# Patient Record
Sex: Female | Born: 1945 | Race: White | Hispanic: No | Marital: Married | Smoking: Never smoker
Health system: Southern US, Community
[De-identification: ages and names within clinical notes are randomized; demographics above are authoritative.]

## PROBLEM LIST (undated history)

## (undated) DIAGNOSIS — N951 Menopausal and female climacteric states: Secondary | ICD-10-CM

## (undated) DIAGNOSIS — E039 Hypothyroidism, unspecified: Secondary | ICD-10-CM

## (undated) DIAGNOSIS — Z8619 Personal history of other infectious and parasitic diseases: Secondary | ICD-10-CM

## (undated) DIAGNOSIS — N6310 Unspecified lump in the right breast, unspecified quadrant: Secondary | ICD-10-CM

## (undated) DIAGNOSIS — M1990 Unspecified osteoarthritis, unspecified site: Secondary | ICD-10-CM

## (undated) DIAGNOSIS — M351 Other overlap syndromes: Secondary | ICD-10-CM

## (undated) DIAGNOSIS — Z8719 Personal history of other diseases of the digestive system: Secondary | ICD-10-CM

## (undated) DIAGNOSIS — M8580 Other specified disorders of bone density and structure, unspecified site: Secondary | ICD-10-CM

## (undated) DIAGNOSIS — M359 Systemic involvement of connective tissue, unspecified: Secondary | ICD-10-CM

## (undated) DIAGNOSIS — K219 Gastro-esophageal reflux disease without esophagitis: Secondary | ICD-10-CM

## (undated) DIAGNOSIS — K388 Other specified diseases of appendix: Secondary | ICD-10-CM

## (undated) HISTORY — DX: Unspecified lump in the right breast, unspecified quadrant: N63.10

## (undated) HISTORY — DX: Other overlap syndromes: M35.1

## (undated) HISTORY — DX: Hypothyroidism, unspecified: E03.9

## (undated) HISTORY — DX: Menopausal and female climacteric states: N95.1

## (undated) HISTORY — PX: COLONOSCOPY: SHX174

## (undated) HISTORY — DX: Unspecified osteoarthritis, unspecified site: M19.90

## (undated) HISTORY — DX: Gastro-esophageal reflux disease without esophagitis: K21.9

---

## 2011-08-24 ENCOUNTER — Encounter (INDEPENDENT_AMBULATORY_CARE_PROVIDER_SITE_OTHER): Payer: Self-pay | Admitting: *Deleted

## 2011-09-04 ENCOUNTER — Ambulatory Visit (INDEPENDENT_AMBULATORY_CARE_PROVIDER_SITE_OTHER): Payer: 59 | Admitting: Internal Medicine

## 2011-09-04 ENCOUNTER — Other Ambulatory Visit (INDEPENDENT_AMBULATORY_CARE_PROVIDER_SITE_OTHER): Payer: Self-pay | Admitting: *Deleted

## 2011-09-04 ENCOUNTER — Telehealth (INDEPENDENT_AMBULATORY_CARE_PROVIDER_SITE_OTHER): Payer: Self-pay | Admitting: *Deleted

## 2011-09-04 ENCOUNTER — Encounter (INDEPENDENT_AMBULATORY_CARE_PROVIDER_SITE_OTHER): Payer: Self-pay | Admitting: Internal Medicine

## 2011-09-04 VITALS — BP 108/58 | HR 72 | Temp 97.9°F | Ht 66.0 in | Wt 150.7 lb

## 2011-09-04 DIAGNOSIS — E039 Hypothyroidism, unspecified: Secondary | ICD-10-CM | POA: Insufficient documentation

## 2011-09-04 DIAGNOSIS — Z1211 Encounter for screening for malignant neoplasm of colon: Secondary | ICD-10-CM

## 2011-09-04 DIAGNOSIS — K219 Gastro-esophageal reflux disease without esophagitis: Secondary | ICD-10-CM

## 2011-09-04 DIAGNOSIS — Z139 Encounter for screening, unspecified: Secondary | ICD-10-CM

## 2011-09-04 MED ORDER — PEG-KCL-NACL-NASULF-NA ASC-C 100 G PO SOLR: 1.0000 | Freq: Once | ORAL | Status: DC

## 2011-09-04 NOTE — Telephone Encounter (Signed)
Patient needs movi prep 

## 2011-09-04 NOTE — Progress Notes (Signed)
Subjective:     Patient ID: Pamela Morrow. [place], female   DOB: 11-Oct-1945, 66 y.o.   MRN: [id number]  HPI Referred by Dr. Sherryll Burger for a screening colonoscopy.  She tells me she lost a sister 1 1/2 yrs ago with cervical cancer.  She tells me she has epigastric pain and stress after her sister died. She tells me now she has acid reflux. No problems till her sister died. She says her throat feels scratchy.  She has frequent burping.  Sometimes capsules or pills feel like they are lodging. She tried Nexium and gave her headaches.  She does not want to take a PPI till she knows she has to. She is requesting an EGD.  Father died of esophagea cancer.  Father was a drinker and a smoker. BMs x once a day in am. No melena or bright red rectal bleeding.   Review of Systems see hpi Current Outpatient Prescriptions  Medication Sig Dispense Refill  . acetaminophen (TYLENOL) 325 MG tablet Take 650 mg by mouth every 6 (six) hours as needed.      . Cholecalciferol (VITAMIN D) 2000 UNITS tablet Take 2,000 Units by mouth daily.      . naltrexone (DEPADE) 50 MG tablet Take 3 mg by mouth daily.      . progesterone (ENDOMETRIN) 100 MG vaginal insert Place 100 mg vaginally 2 (two) times daily.      Marland Kitchen Specialty Vitamins Products (MAGNESIUM, AMINO ACID CHELATE,) 133 MG tablet Take 1 tablet by mouth 2 (two) times daily.      Marland Kitchen thyroid (ARMOUR) 30 MG tablet Take 30 mg by mouth daily.       Past Medical History  Diagnosis Date  . Hypothyroid    No family status information on file.   History   Social History  . Marital Status: Married    Spouse Name: N/A    Number of Children: N/A  . Years of Education: N/A   Occupational History  . Not on file.   Social History Main Topics  . Smoking status: Never Smoker   . Smokeless tobacco: Not on file  . Alcohol Use: Yes     2 drinks a month  . Drug Use: No  . Sexually Active: Not on file   Other Topics Concern  . Not on file   Social History Narrative  . No  narrative on file   Allergies  Allergen Reactions  . Codeine     Nausea        Objective:   Physical Exam Filed Vitals:   09/04/11 0949  Height: 5\' 6"  (1.676 m)  Weight: 150 lb 11.2 oz (68.357 kg)   Alert and oriented. Skin warm and dry. Oral mucosa is moist.   . Sclera anicteric, conjunctivae is pink. Thyroid not enlarged. No cervical lymphadenopathy. Lungs clear. Heart regular rate and rhythm.  Abdomen is soft. Bowel sounds are positive. No hepatomegaly. No abdominal masses felt. No tenderness.  No edema to lower extremities.       Assessment:    GERD uncontrolled at this time. She does not want to be maintained on a PPI till she has EGD.  She has never undergone a screening colonoscopy. No family hx of colon cancer. Hx of esophageal cancer: father.  Father had hx of ETOH abuse and was a smoker.   Plan:    EGD/Colonoscopy. The risks and benefits such as perforation, bleeding, and infection were reviewed with the patient and is agreeable.

## 2011-09-04 NOTE — Patient Instructions (Addendum)
EGD/Colonoscopy. The risks and benefits such as perforation, bleeding, and infection were reviewed with the patient and is agreeable. 

## 2011-09-19 ENCOUNTER — Encounter (HOSPITAL_COMMUNITY): Payer: Self-pay | Admitting: Pharmacy Technician

## 2011-10-03 MED ORDER — SODIUM CHLORIDE 0.45 % IV SOLN
INTRAVENOUS | Status: DC
  Administered 2011-10-04: 1000 mL via INTRAVENOUS

## 2011-10-04 ENCOUNTER — Encounter (HOSPITAL_COMMUNITY)
Admission: RE | Disposition: A | Discharge status: Home / Self Care | Payer: Self-pay | Source: Ambulatory Visit | Attending: Internal Medicine

## 2011-10-04 ENCOUNTER — Ambulatory Visit (HOSPITAL_COMMUNITY)
Admission: RE | Admit: 2011-10-04 | Discharge: 2011-10-04 | Disposition: A | Discharge status: Home / Self Care | Payer: 59 | Source: Ambulatory Visit | Attending: Internal Medicine | Admitting: Internal Medicine

## 2011-10-04 ENCOUNTER — Encounter (HOSPITAL_COMMUNITY): Payer: Self-pay | Admitting: *Deleted

## 2011-10-04 DIAGNOSIS — K228 Other specified diseases of esophagus: Secondary | ICD-10-CM

## 2011-10-04 DIAGNOSIS — K6389 Other specified diseases of intestine: Secondary | ICD-10-CM

## 2011-10-04 DIAGNOSIS — Z139 Encounter for screening, unspecified: Secondary | ICD-10-CM

## 2011-10-04 DIAGNOSIS — K2100 Gastro-esophageal reflux disease with esophagitis, without bleeding: Secondary | ICD-10-CM | POA: Insufficient documentation

## 2011-10-04 DIAGNOSIS — K219 Gastro-esophageal reflux disease without esophagitis: Secondary | ICD-10-CM

## 2011-10-04 DIAGNOSIS — A048 Other specified bacterial intestinal infections: Secondary | ICD-10-CM | POA: Insufficient documentation

## 2011-10-04 DIAGNOSIS — K208 Other esophagitis: Secondary | ICD-10-CM

## 2011-10-04 DIAGNOSIS — Z1211 Encounter for screening for malignant neoplasm of colon: Secondary | ICD-10-CM | POA: Insufficient documentation

## 2011-10-04 DIAGNOSIS — R1013 Epigastric pain: Secondary | ICD-10-CM | POA: Insufficient documentation

## 2011-10-04 DIAGNOSIS — D126 Benign neoplasm of colon, unspecified: Secondary | ICD-10-CM | POA: Insufficient documentation

## 2011-10-04 DIAGNOSIS — K2940 Chronic atrophic gastritis without bleeding: Secondary | ICD-10-CM | POA: Insufficient documentation

## 2011-10-04 DIAGNOSIS — K644 Residual hemorrhoidal skin tags: Secondary | ICD-10-CM | POA: Insufficient documentation

## 2011-10-04 SURGERY — COLONOSCOPY WITH ESOPHAGOGASTRODUODENOSCOPY (EGD)
Anesthesia: Moderate Sedation

## 2011-10-04 MED ORDER — BUTAMBEN-TETRACAINE-BENZOCAINE 2-2-14 % EX AERO
INHALATION_SPRAY | CUTANEOUS | Status: DC | PRN
  Administered 2011-10-04: 1 via TOPICAL
  Administered 2011-10-04: 2 via TOPICAL

## 2011-10-04 MED ORDER — MEPERIDINE HCL 50 MG/ML IJ SOLN
INTRAMUSCULAR | Status: AC
  Filled 2011-10-04: qty 1

## 2011-10-04 MED ORDER — MIDAZOLAM HCL 5 MG/5ML IJ SOLN
INTRAMUSCULAR | Status: DC | PRN
  Administered 2011-10-04 (×5): 2 mg via INTRAVENOUS

## 2011-10-04 MED ORDER — MEPERIDINE HCL 50 MG/ML IJ SOLN
INTRAMUSCULAR | Status: DC | PRN
  Administered 2011-10-04 (×2): 25 mg via INTRAVENOUS

## 2011-10-04 MED ORDER — STERILE WATER FOR IRRIGATION IR SOLN
Status: DC | PRN
  Administered 2011-10-04 (×2)

## 2011-10-04 MED ORDER — MIDAZOLAM HCL 5 MG/5ML IJ SOLN
INTRAMUSCULAR | Status: AC
  Filled 2011-10-04: qty 10

## 2011-10-04 MED ORDER — PANTOPRAZOLE SODIUM 40 MG PO TBEC: 40.0000 mg | DELAYED_RELEASE_TABLET | Freq: Every day | ORAL | Status: DC

## 2011-10-04 NOTE — Op Note (Signed)
EGD PROCEDURE REPORT  PATIENT:  Pamela Morrow. Pamela Morrow  MR#:  [id number] Birthdate:  05-14-1945, 66 y.o., female Endoscopist:  Dr. Malissa Hippo, MD Referred By:  Dr. Kirstie Peri, MD Procedure Date: 10/04/2011  Procedure:   EGD & Colonoscopy  Indications:  Patient is 66 year old Caucasian female with epigastric pain intermittent heartburn and regurgitation of recent onset. She is undergoing diagnostic EGD followed by screening colonoscopy.            Informed Consent:  The risks, benefits, alternatives & imponderables which include, but are not limited to, bleeding, infection, perforation, drug reaction and potential missed lesion have been reviewed.  The potential for biopsy, lesion removal, esophageal dilation, etc. have also been discussed.  Questions have been answered.  All parties agreeable.  Please see history & physical in medical record for more information.  Medications:  Demerol 50 mg IV Versed 10 mg IV Cetacaine spray topically for oropharyngeal anesthesia  EGD  Description of procedure:  The endoscope was introduced through the mouth and advanced to the second portion of the duodenum without difficulty or limitations. The mucosal surfaces were surveyed very carefully during advancement of the scope and upon withdrawal.  Findings:  Esophagus:  Mucosa of the proximal and middle third was normal. Distally 2 small erosions noted in the background of erythematous mucosa just proximal to GE junction. Focal edema and erythema noted at GE junction. No esophageal varices noted. GEJ:  37cm Hiatus:  39 cm Stomach:  Stomach was empty and distended very well with insufflation. Folds in the proximal stomach were normal. Examination of mucosa at body revealed patchy erythema as well as mucosal edema. These changes suspicious for portal gastropathy. Antral mucosa was normal. Pyloric channel was patent. Angularis fundus and cardia were examined by retroflexing the scope and were normal. Duodenum:   Normal bulbar and post bulbar mucosa.  Therapeutic/Diagnostic Maneuvers Performed:  Biopsy taken from mucosa at gastric body for routine histology.  COLONOSCOPY Description of procedure:  After a digital rectal exam was performed, that colonoscope was advanced from the anus through the rectum and colon to the area of the cecum, ileocecal valve and appendiceal orifice. The cecum was deeply intubated. These structures were well-seen and photographed for the record. From the level of the cecum and ileocecal valve, the scope was slowly and cautiously withdrawn. The mucosal surfaces were carefully surveyed utilizing scope tip to flexion to facilitate fold flattening as needed. The scope was pulled down into the rectum where a thorough exam including retroflexion was performed.  Findings:   Prep satisfactory. 3 mm polyp ablated via cold biopsy from cecum. 12 mm round submucosal lesion involving blunt and the cecum possibly submucosal leiomyoma. Normal rectal mucosa. Small hemorrhoids below the dentate line.  Therapeutic/Diagnostic Maneuvers Performed:  See above.  Complications:  None  Cecal Withdrawal Time:  14 minutes  Impression:  Erosive reflux esophagitis and small sliding hiatal hernia. Mucosal changes involving gastric body suspicious for portal gastropathy. Biopsy taken. No evidence of esophageal or gastric varices. 3 mm polyp ablated via cold biopsy from cecum. 12 mm submucosal lesion at cecum possibly leiomyoma. Small external hemorrhoids.  Recommendations:  Anti-reflux measures as before. Pantoprazole 40 mg by mouth every morning. Abdominopelvic CT with contrast to examine liver and cecum.  Pamela Morrow U  10/04/2011 1:44 PM  CC: Dr. Orvilla Cornwall C & Dr. Bonnetta Barry ref. provider found

## 2011-10-04 NOTE — H&P (Signed)
Pamela Morrow is an 66 y.o. female.   Chief Complaint: Patient is here for EGD and colonoscopy. HPI; Patient is 22 Caucasian female who presents with intermittent epigastric pain heartburn and nocturnal regurgitation. Medications she wakes up in the morning scratchy throat. She was prescribed Nexium by her primary care physician Dr. Sherryll Burger headache and therefore discontinued medication. She's been using OTC antacids on when necessary basis. She denies dysphagia melena or rectal bleeding. She is also undergoing screening colonoscopy. Him history is negative for colorectal carcinoma but positive for cervical carcinoma in her sister who died 2 years ago at age 72. Her father died of esophageal cancer at age 34.   Past Medical History  Diagnosis Date  . Hypothyroid     No past surgical history on file.  Family History  Problem Relation Age of Onset  . Colitis Sister    Social History:  reports that she has never smoked. She does not have any smokeless tobacco history on file. She reports that she drinks alcohol. She reports that she does not use illicit drugs.  Allergies:  Allergies  Allergen Reactions  . Codeine     Nausea    Medications Prior to Admission  Medication Sig Dispense Refill  . acetaminophen (TYLENOL) 325 MG tablet Take 650 mg by mouth every 6 (six) hours as needed. For pain      . Cholecalciferol (HM VITAMIN D3) 4000 UNITS CAPS Take 1 capsule by mouth daily.      . naltrexone (DEPADE) 50 MG tablet Take 50 mg by mouth daily.       Marland Kitchen PRESCRIPTION MEDICATION Apply 1 application topically daily. Compounded natural estrogen cream      . progesterone (ENDOMETRIN) 100 MG vaginal insert Place 100 mg vaginally daily.       Marland Kitchen Specialty Vitamins Products (MAGNESIUM, AMINO ACID CHELATE,) 133 MG tablet Take 1 tablet by mouth 2 (two) times daily.      Marland Kitchen thyroid (ARMOUR) 30 MG tablet Take 30 mg by mouth daily.        No results found for this or any previous visit (from the past 48  hour(s)). No results found.  ROS  Blood pressure 134/84, pulse 80, temperature 97.5 F (36.4 C), temperature source Oral, resp. rate 16, height 5\' 6"  (1.676 m), weight 148 lb (67.132 kg), SpO2 99.00%. Physical Exam  Constitutional: She appears well-developed and well-nourished.  HENT:  Mouth/Throat: Oropharynx is clear and moist.  Eyes: Conjunctivae normal are normal. No scleral icterus.  Neck: No thyromegaly present.  Cardiovascular: Normal rate and normal heart sounds.   No murmur heard. Respiratory: Effort normal and breath sounds normal.  GI: Soft. She exhibits no distension and no mass.  Musculoskeletal: She exhibits no edema.  Lymphadenopathy:    She has no cervical adenopathy.  Neurological: She is alert.  Skin: Skin is warm and dry.     Assessment/Plan Epigastric pain and GERD. Diagnostic EGD and screening colonoscopy.  Maedell Hedger U 10/04/2011, 12:49 PM

## 2011-10-09 ENCOUNTER — Other Ambulatory Visit (INDEPENDENT_AMBULATORY_CARE_PROVIDER_SITE_OTHER): Payer: Self-pay | Admitting: Internal Medicine

## 2011-10-09 ENCOUNTER — Telehealth (INDEPENDENT_AMBULATORY_CARE_PROVIDER_SITE_OTHER): Payer: Self-pay | Admitting: *Deleted

## 2011-10-09 DIAGNOSIS — K639 Disease of intestine, unspecified: Secondary | ICD-10-CM

## 2011-10-09 DIAGNOSIS — Z0189 Encounter for other specified special examinations: Secondary | ICD-10-CM

## 2011-10-09 MED ORDER — BIS SUBCIT-METRONID-TETRACYC 140-125-125 MG PO CAPS: 3.0000 | ORAL_CAPSULE | Freq: Three times a day (TID) | ORAL | Status: DC

## 2011-10-09 NOTE — Telephone Encounter (Signed)
Pamela Morrow will be having  CT A/P 10/16/11 and will need creatinine, she will go to [facility] on 10/11/11 for lab, thanks

## 2011-10-09 NOTE — Telephone Encounter (Signed)
Patient to have diagnostic exam

## 2011-10-09 NOTE — Telephone Encounter (Signed)
Order noted and faxed to [facility]

## 2011-10-11 ENCOUNTER — Other Ambulatory Visit (INDEPENDENT_AMBULATORY_CARE_PROVIDER_SITE_OTHER): Payer: Self-pay | Admitting: Internal Medicine

## 2011-10-11 LAB — CREATININE, SERUM: Creat: 0.78 mg/dL (ref 0.50–1.10)

## 2011-10-16 ENCOUNTER — Ambulatory Visit (HOSPITAL_COMMUNITY)
Admission: RE | Admit: 2011-10-16 | Discharge: 2011-10-16 | Disposition: A | Discharge status: Home / Self Care | Payer: 59 | Source: Ambulatory Visit | Attending: Internal Medicine | Admitting: Internal Medicine

## 2011-10-16 DIAGNOSIS — R918 Other nonspecific abnormal finding of lung field: Secondary | ICD-10-CM | POA: Insufficient documentation

## 2011-10-16 DIAGNOSIS — K639 Disease of intestine, unspecified: Secondary | ICD-10-CM

## 2011-10-16 DIAGNOSIS — K6389 Other specified diseases of intestine: Secondary | ICD-10-CM | POA: Insufficient documentation

## 2011-10-16 DIAGNOSIS — R933 Abnormal findings on diagnostic imaging of other parts of digestive tract: Secondary | ICD-10-CM | POA: Insufficient documentation

## 2011-10-16 MED ORDER — IOHEXOL 300 MG/ML  SOLN
100.0000 mL | Freq: Once | INTRAMUSCULAR | Status: AC | PRN
  Administered 2011-10-16: 100 mL via INTRAVENOUS

## 2011-10-18 ENCOUNTER — Encounter (INDEPENDENT_AMBULATORY_CARE_PROVIDER_SITE_OTHER): Payer: Self-pay | Admitting: *Deleted

## 2011-10-19 ENCOUNTER — Encounter (INDEPENDENT_AMBULATORY_CARE_PROVIDER_SITE_OTHER): Payer: 59 | Admitting: Internal Medicine

## 2011-10-19 ENCOUNTER — Encounter (INDEPENDENT_AMBULATORY_CARE_PROVIDER_SITE_OTHER): Payer: Self-pay | Admitting: Internal Medicine

## 2011-10-19 VITALS — BP 110/70 | HR 74 | Temp 97.6°F | Resp 18 | Ht 66.0 in | Wt 149.0 lb

## 2011-10-19 NOTE — Progress Notes (Signed)
Apt has been scheduled for 10/19/11 at 4:30 pm with Dr. Karilyn Cota.

## 2011-10-23 ENCOUNTER — Telehealth (INDEPENDENT_AMBULATORY_CARE_PROVIDER_SITE_OTHER): Payer: Self-pay | Admitting: *Deleted

## 2011-10-23 NOTE — Telephone Encounter (Signed)
Pamela Morrow called and ask if Dr.Rehman had a recommendation for a Surgeon. She was to contact him after there talk reviewing the CT on Friday. Per Dr.Rehman , he recommends Dr. Luretha Murphy in [place]. The patient was notified and she would like to move forward with a surgical consult with him. She was advised that Luster Landsberg would make this appointment and either she or Dr. Pedro Earls office would contact her the appointment.

## 2011-10-24 NOTE — Telephone Encounter (Signed)
appt w/ Dr Daphine Deutscher 11/13/11 at 10:40, patient is aware of appt

## 2011-10-29 DIAGNOSIS — D373 Neoplasm of uncertain behavior of appendix: Secondary | ICD-10-CM | POA: Insufficient documentation

## 2011-10-29 NOTE — Progress Notes (Deleted)
Patient did stop by at a hospital on 10/20/2011 along with her husband. I went over CT findings with both of them.  I recommended that she undergo surgery an appointment was made with Dr. Matthew Martin of [facility] [place] [facility].   

## 2011-10-31 ENCOUNTER — Telehealth (INDEPENDENT_AMBULATORY_CARE_PROVIDER_SITE_OTHER): Payer: Self-pay | Admitting: Internal Medicine

## 2011-10-31 NOTE — Progress Notes (Signed)
This encounter was created in error - please disregard.

## 2011-10-31 NOTE — Telephone Encounter (Signed)
Patient did stop by at a hospital on 10/20/2011 along with her husband. I went over CT findings with both of them.  I recommended that she undergo surgery an appointment was made with Dr. Luretha Murphy of [facility].

## 2011-11-13 ENCOUNTER — Encounter (INDEPENDENT_AMBULATORY_CARE_PROVIDER_SITE_OTHER): Payer: Self-pay | Admitting: Surgery

## 2011-11-13 ENCOUNTER — Ambulatory Visit (INDEPENDENT_AMBULATORY_CARE_PROVIDER_SITE_OTHER): Payer: 59 | Admitting: Surgery

## 2011-11-13 VITALS — BP 120/72 | HR 72 | Temp 97.6°F | Resp 16 | Ht 66.0 in | Wt 149.6 lb

## 2011-11-13 DIAGNOSIS — D373 Neoplasm of uncertain behavior of appendix: Secondary | ICD-10-CM

## 2011-11-13 DIAGNOSIS — D378 Neoplasm of uncertain behavior of other specified digestive organs: Secondary | ICD-10-CM

## 2011-11-13 NOTE — Patient Instructions (Signed)
Thanks for your patience.  If you need further assistance after leaving the office, please call our office and speak with a [facility] nurse.  [phone].  If you want to leave a message for Dr. Shakira Los, please call his office phone at [phone]. 

## 2011-11-13 NOTE — Progress Notes (Signed)
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 Karilyn Cota  History of Present Illness:  Pamela Morrow. Leon is an 66 y.o. female who comes in with her husband with the above complaint.  I reviewed the CT scan report with her including a recommendation for a 6 month followup CT chest.  She is aware and knows to remind her doctors to schedule this.  In the meantime, I discussed lap assisted right hemicolectomy with them including complications not limited to bowel leakage, abscess, etc and the need to check the specimen to rule out cancer or carcinoid tumor of the appendix.  They may want to wait and schedule in January.    Past Medical History  Diagnosis Date  . Hypothyroid   . Arthritis   . GERD (gastroesophageal reflux disease)     Past Surgical History  Procedure Date  . Colonoscopy     Current Outpatient Prescriptions  Medication Sig Dispense Refill  . acetaminophen (TYLENOL) 325 MG tablet Take 650 mg by mouth as needed. For pain      . Cholecalciferol (HM VITAMIN D3) 4000 UNITS CAPS Take 1 capsule by mouth daily.      . naltrexone (DEPADE) 50 MG tablet Take 50 mg by mouth daily.       Marland Kitchen nystatin-triamcinolone (MYCOLOG II) cream Apply 1 application topically as needed.       Marland Kitchen PRESCRIPTION MEDICATION Apply 1 application topically daily. Compounded natural estrogen cream      . progesterone (ENDOMETRIN) 100 MG vaginal insert Place 100 mg vaginally daily.       Marland Kitchen Specialty Vitamins Products (MAGNESIUM, AMINO ACID CHELATE,) 133 MG tablet Take 1 tablet by mouth daily. Per patient she uses this once a day in a smoothe----- This is powder form      . thyroid (ARMOUR) 30 MG tablet Take 30 mg by mouth daily.      . pantoprazole (PROTONIX) 40 MG tablet Take 1 tablet (40 mg total) by mouth daily before breakfast.  30 tablet  5   Codeine Family History  Problem Relation Age of Onset  . Colitis Sister   . Cancer Sister     cervial  . Cancer Father     esophageal   Social  History:   reports that she has never smoked. She has never used smokeless tobacco. She reports that she drinks alcohol. She reports that she does not use illicit drugs.   REVIEW OF SYSTEMS - PERTINENT POSITIVES ONLY: Negative for DVT  Physical Exam:   Blood pressure 120/72, pulse 72, temperature 97.6 F (36.4 C), temperature source Temporal, resp. rate 16, height 5\' 6"  (1.676 m), weight 149 lb 9.6 oz (67.858 kg). Body mass index is 24.15 kg/(m^2).  Gen:  WDWN WF NAD  Neurological: Alert and oriented to person, place, and time. Motor and sensory function is grossly intact  Head: Normocephalic and atraumatic.  Eyes: Conjunctivae are normal. Pupils are equal, round, and reactive to light. No scleral icterus.  Neck: Normal range of motion. Neck supple. No tracheal deviation or thyromegaly present.  Cardiovascular:  SR without murmurs or gallops.  No carotid bruits Respiratory: Effort normal.  No respiratory distress. No chest wall tenderness. Breath sounds normal.  No wheezes, rales or rhonchi.  Abdomen:  nontender GU: Musculoskeletal: Normal range of motion. Extremities are nontender. No cyanosis, edema or clubbing noted Lymphadenopathy: No cervical, preauricular, postauricular or axillary adenopathy is present Skin: Skin is warm and dry. No rash noted. No diaphoresis. No erythema.  No pallor. Pscyh: Normal mood and affect. Behavior is normal. Judgment and thought content normal.   LABORATORY RESULTS: No results found for this or any previous visit (from the past 48 hour(s)).  RADIOLOGY RESULTS: No results found.  Problem List: Patient Active Problem List  Diagnosis  . Hypothyroidism  . Encounter for screening colonoscopy  . GERD (gastroesophageal reflux disease)  . 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  Plan lap assisted right hemicolectomy  Booklet given.  Schedule at [facility] B. Daphine Deutscher, MD, [facility] [phone] beeper [phone]  11/13/2011 12:12 PM

## 2012-01-15 ENCOUNTER — Encounter (HOSPITAL_COMMUNITY): Payer: Self-pay | Admitting: Pharmacy Technician

## 2012-01-19 ENCOUNTER — Encounter (HOSPITAL_COMMUNITY)
Admission: RE | Admit: 2012-01-19 | Discharge: 2012-01-19 | Disposition: A | Discharge status: Home / Self Care | Payer: Medicare Other | Source: Ambulatory Visit | Attending: Surgery | Admitting: Surgery

## 2012-01-19 ENCOUNTER — Encounter (HOSPITAL_COMMUNITY): Payer: Self-pay

## 2012-01-19 HISTORY — DX: Other specified diseases of appendix: K38.8

## 2012-01-19 HISTORY — DX: Personal history of other diseases of the digestive system: Z87.19

## 2012-01-19 HISTORY — DX: Other specified disorders of bone density and structure, unspecified site: M85.80

## 2012-01-19 HISTORY — DX: Systemic involvement of connective tissue, unspecified: M35.9

## 2012-01-19 HISTORY — DX: Personal history of other infectious and parasitic diseases: Z86.19

## 2012-01-19 LAB — BASIC METABOLIC PANEL
BUN: 9 mg/dL (ref 6–23)
Calcium: 10 mg/dL (ref 8.4–10.5)
Creatinine, Ser: 0.69 mg/dL (ref 0.50–1.10)
GFR calc Af Amer: >90 mL/min (ref >90)
GFR calc non Af Amer: 89 mL/min — ABNORMAL LOW (ref >90)

## 2012-01-19 LAB — CBC
HCT: 40.7 % (ref 36.0–46.0)
Hemoglobin: 14.3 g/dL (ref 12.0–15.0)
MCH: 33.6 pg (ref 26.0–34.0)
MCV: 95.8 fL (ref 78.0–100.0)
RBC: 4.25 MIL/uL (ref 3.87–5.11)

## 2012-01-19 NOTE — Patient Instructions (Addendum)
Pamela Morrow  01/19/2012                           YOUR PROCEDURE IS SCHEDULED ON:  01/26/12               PLEASE REPORT TO SHORT STAY CENTER AT :  5:15 AM               CALL THIS NUMBER IF ANY PROBLEMS THE DAY OF SURGERY :               [phone]                      REMEMBER:   Do not eat food or drink liquids AFTER MIDNIGHT   Take these medicines the morning of surgery with A SIP OF WATER:  THYROID MEDICATION   Do not wear jewelry, make-up   Do not wear lotions, powders, or perfumes.   Do not shave legs or underarms 12 hrs. before surgery (men may shave face)  Do not bring valuables to the hospital.  Contacts, dentures or bridgework may not be worn into surgery.  Leave suitcase in the car. After surgery it may be brought to your room.  For patients admitted to the hospital more than one night, checkout time is 11:00                          The day of discharge.   Patients discharged the day of surgery will not be allowed to drive home                             If going home same day of surgery, must have someone stay with you first                           24 hrs at home and arrange for some one to drive you home from hospital.    Special Instructions:   Please read over the following fact sheets that you were given:               1. MRSA  INFORMATION                      2. [place] PREPARING FOR SURGERY SHEET               3. USE FLEET ENEMA THE NIGHT BEFORE SURGERY               4. STOP ALL ASPIRIN AND HERBAL PRODUCTS 5 DAYS PREOP                                                X_____________________________________________________________________        Failure to follow these instructions may result in cancellation of your surgery

## 2012-01-23 ENCOUNTER — Telehealth (INDEPENDENT_AMBULATORY_CARE_PROVIDER_SITE_OTHER): Payer: Self-pay | Admitting: General Surgery

## 2012-01-23 NOTE — Telephone Encounter (Signed)
Pt called with generalized questions about bowel prep with Mag Citrate.  Discussed clear liquids and volume replacement for lost fluids during her prep.  She understands; will call back prn.

## 2012-01-25 NOTE — H&P (Signed)
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 Karilyn Cota  History of Present Illness: Pamela Morrow. Pamela Morrow is an 67 y.o. female who comes in with her husband with the above complaint. I reviewed the CT scan report with her including a recommendation for a 6 month followup CT chest. She is aware and knows to remind her doctors to schedule this. In the meantime, I discussed lap assisted right hemicolectomy with them including complications not limited to bowel leakage, abscess, etc and the need to check the specimen to rule out cancer or carcinoid tumor of the appendix. They may want to wait and schedule in January.  Past Medical History   Diagnosis  Date   .  Hypothyroid    .  Arthritis    .  GERD (gastroesophageal reflux disease)     Past Surgical History   Procedure  Date   .  Colonoscopy     Current Outpatient Prescriptions   Medication  Sig  Dispense  Refill   .  acetaminophen (TYLENOL) 325 MG tablet  Take 650 mg by mouth as needed. For pain     .  Cholecalciferol (HM VITAMIN D3) 4000 UNITS CAPS  Take 1 capsule by mouth daily.     .  naltrexone (DEPADE) 50 MG tablet  Take 50 mg by mouth daily.     Marland Kitchen  nystatin-triamcinolone (MYCOLOG II) cream  Apply 1 application topically as needed.     Marland Kitchen  PRESCRIPTION MEDICATION  Apply 1 application topically daily. Compounded natural estrogen cream     .  progesterone (ENDOMETRIN) 100 MG vaginal insert  Place 100 mg vaginally daily.     Marland Kitchen  Specialty Vitamins Products (MAGNESIUM, AMINO ACID CHELATE,) 133 MG tablet  Take 1 tablet by mouth daily. Per patient she uses this once a day in a smoothe----- This is powder form     .  thyroid (ARMOUR) 30 MG tablet  Take 30 mg by mouth daily.     .  pantoprazole (PROTONIX) 40 MG tablet  Take 1 tablet (40 mg total) by mouth daily before breakfast.  30 tablet  5    Codeine  Family History   Problem  Relation  Age of Onset   .  Colitis  Sister    .  Cancer  Sister       cervial    .  Cancer   Father       esophageal    Social History: reports that she has never smoked. She has never used smokeless tobacco. She reports that she drinks alcohol. She reports that she does not use illicit drugs.  REVIEW OF SYSTEMS - PERTINENT POSITIVES ONLY:  Negative for DVT  Physical Exam:  Blood pressure 120/72, pulse 72, temperature 97.6 F (36.4 C), temperature source Temporal, resp. rate 16, height 5\' 6"  (1.676 m), weight 149 lb 9.6 oz (67.858 kg).  Body mass index is 24.15 kg/(m^2).  Gen: WDWN WF NAD  Neurological: Alert and oriented to person, place, and time. Motor and sensory function is grossly intact  Head: Normocephalic and atraumatic.  Eyes: Conjunctivae are normal. Pupils are equal, round, and reactive to light. No scleral icterus.  Neck: Normal range of motion. Neck supple. No tracheal deviation or thyromegaly present.  Cardiovascular: SR without murmurs or gallops. No carotid bruits  Respiratory: Effort normal. No respiratory distress. No chest wall tenderness. Breath sounds normal. No wheezes, rales or rhonchi.  Abdomen: nontender  GU:  Musculoskeletal: Normal range of motion. Extremities  are nontender. No cyanosis, edema or clubbing noted Lymphadenopathy: No cervical, preauricular, postauricular or axillary adenopathy is present Skin: Skin is warm and dry. No rash noted. No diaphoresis. No erythema. No pallor. Pscyh: Normal mood and affect. Behavior is normal. Judgment and thought content normal.  LABORATORY RESULTS:  No results found for this or any previous visit (from the past 48 hour(s)).  RADIOLOGY RESULTS:  No results found.  Problem List:  Patient Active Problem List   Diagnosis   .  Hypothyroidism   .  Encounter for screening colonoscopy   .  GERD (gastroesophageal reflux disease)   .  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 Plan lap assisted right hemicolectomy  Booklet given. Schedule at [facility].  Daphine Deutscher, MD, [facility]  [phone] beeper  [phone]

## 2012-01-26 ENCOUNTER — Ambulatory Visit (HOSPITAL_COMMUNITY): Payer: Medicare Other | Admitting: Registered Nurse

## 2012-01-26 ENCOUNTER — Encounter (HOSPITAL_COMMUNITY): Payer: Self-pay | Admitting: Registered Nurse

## 2012-01-26 ENCOUNTER — Encounter (HOSPITAL_COMMUNITY)
Admission: RE | Disposition: A | Discharge status: Home / Self Care | Payer: Self-pay | Source: Ambulatory Visit | Attending: Surgery

## 2012-01-26 ENCOUNTER — Encounter (HOSPITAL_COMMUNITY): Payer: Self-pay | Admitting: *Deleted

## 2012-01-26 ENCOUNTER — Inpatient Hospital Stay (HOSPITAL_COMMUNITY)
Admission: RE | Admit: 2012-01-26 | Discharge: 2012-01-31 | DRG: 329 | Disposition: A | Discharge status: Home / Self Care | Payer: Medicare Other | Source: Ambulatory Visit | Attending: Surgery | Admitting: Surgery

## 2012-01-26 DIAGNOSIS — K922 Gastrointestinal hemorrhage, unspecified: Secondary | ICD-10-CM | POA: Diagnosis not present

## 2012-01-26 DIAGNOSIS — Z791 Long term (current) use of non-steroidal anti-inflammatories (NSAID): Secondary | ICD-10-CM

## 2012-01-26 DIAGNOSIS — D373 Neoplasm of uncertain behavior of appendix: Secondary | ICD-10-CM

## 2012-01-26 DIAGNOSIS — D126 Benign neoplasm of colon, unspecified: Secondary | ICD-10-CM

## 2012-01-26 DIAGNOSIS — K449 Diaphragmatic hernia without obstruction or gangrene: Secondary | ICD-10-CM | POA: Diagnosis present

## 2012-01-26 DIAGNOSIS — Z01812 Encounter for preprocedural laboratory examination: Secondary | ICD-10-CM

## 2012-01-26 DIAGNOSIS — K226 Gastro-esophageal laceration-hemorrhage syndrome: Secondary | ICD-10-CM | POA: Diagnosis not present

## 2012-01-26 DIAGNOSIS — D62 Acute posthemorrhagic anemia: Secondary | ICD-10-CM | POA: Diagnosis not present

## 2012-01-26 DIAGNOSIS — E039 Hypothyroidism, unspecified: Secondary | ICD-10-CM | POA: Diagnosis present

## 2012-01-26 DIAGNOSIS — Z79899 Other long term (current) drug therapy: Secondary | ICD-10-CM

## 2012-01-26 DIAGNOSIS — K219 Gastro-esophageal reflux disease without esophagitis: Secondary | ICD-10-CM | POA: Diagnosis present

## 2012-01-26 DIAGNOSIS — I9589 Other hypotension: Secondary | ICD-10-CM | POA: Diagnosis not present

## 2012-01-26 HISTORY — PX: LAPAROSCOPIC PARTIAL COLECTOMY: SHX5907

## 2012-01-26 LAB — CBC
HCT: 29.1 % — ABNORMAL LOW (ref 36.0–46.0)
MCH: 33 pg (ref 26.0–34.0)
Platelets: 251 10*3/uL (ref 150–400)
Platelets: 255 10*3/uL (ref 150–400)
RBC: 3.36 MIL/uL — ABNORMAL LOW (ref 3.87–5.11)
RDW: 11.8 % (ref 11.5–15.5)
WBC: 13.9 10*3/uL — ABNORMAL HIGH (ref 4.0–10.5)
WBC: 13.4 10*3/uL — ABNORMAL HIGH (ref 4.0–10.5)

## 2012-01-26 LAB — BASIC METABOLIC PANEL
Chloride: 102 mEq/L (ref 96–112)
GFR calc Af Amer: >90 mL/min (ref >90)
GFR calc non Af Amer: >90 mL/min (ref >90)
Potassium: 3.6 mEq/L (ref 3.5–5.1)
Sodium: 134 mEq/L — ABNORMAL LOW (ref 135–145)

## 2012-01-26 LAB — CREATININE, SERUM
Creatinine, Ser: 0.62 mg/dL (ref 0.50–1.10)
GFR calc Af Amer: >90 mL/min (ref >90)

## 2012-01-26 SURGERY — LAPAROSCOPIC PARTIAL COLECTOMY
Anesthesia: General | Site: Abdomen | Wound class: Clean Contaminated

## 2012-01-26 MED ORDER — MORPHINE SULFATE 2 MG/ML IJ SOLN
1.0000 mg | INTRAMUSCULAR | Status: DC | PRN
  Administered 2012-01-26 – 2012-01-27 (×6): 1 mg via INTRAVENOUS
  Filled 2012-01-26 (×6): qty 1

## 2012-01-26 MED ORDER — SODIUM CHLORIDE 0.9 % IV BOLUS (SEPSIS)
500.0000 mL | Freq: Once | INTRAVENOUS | Status: AC
  Administered 2012-01-26: 500 mL via INTRAVENOUS

## 2012-01-26 MED ORDER — ONDANSETRON HCL 4 MG/2ML IJ SOLN
INTRAMUSCULAR | Status: AC
  Filled 2012-01-26: qty 2

## 2012-01-26 MED ORDER — ONDANSETRON HCL 4 MG/2ML IJ SOLN
4.0000 mg | Freq: Four times a day (QID) | INTRAMUSCULAR | Status: DC | PRN
  Administered 2012-01-26: 4 mg via INTRAVENOUS
  Filled 2012-01-26: qty 2

## 2012-01-26 MED ORDER — PROPOFOL 10 MG/ML IV BOLUS
INTRAVENOUS | Status: DC | PRN
  Administered 2012-01-26: 180 mg via INTRAVENOUS
  Administered 2012-01-26: 20 mg via INTRAVENOUS

## 2012-01-26 MED ORDER — PROMETHAZINE HCL 25 MG/ML IJ SOLN: 6.2500 mg | INTRAMUSCULAR | Status: DC | PRN

## 2012-01-26 MED ORDER — HEPARIN SODIUM (PORCINE) 5000 UNIT/ML IJ SOLN
5000.0000 [IU] | Freq: Three times a day (TID) | INTRAMUSCULAR | Status: DC
  Administered 2012-01-26 – 2012-01-27 (×3): 5000 [IU] via SUBCUTANEOUS
  Filled 2012-01-26 (×6): qty 1

## 2012-01-26 MED ORDER — ONDANSETRON HCL 4 MG/2ML IJ SOLN
INTRAMUSCULAR | Status: DC | PRN
  Administered 2012-01-26 (×2): 4 mg via INTRAVENOUS

## 2012-01-26 MED ORDER — DEXAMETHASONE SODIUM PHOSPHATE 10 MG/ML IJ SOLN
INTRAMUSCULAR | Status: DC | PRN
  Administered 2012-01-26: 10 mg via INTRAVENOUS

## 2012-01-26 MED ORDER — ACETAMINOPHEN 10 MG/ML IV SOLN
INTRAVENOUS | Status: AC
  Filled 2012-01-26: qty 100

## 2012-01-26 MED ORDER — OXYCODONE-ACETAMINOPHEN 5-325 MG PO TABS: 1.0000 | ORAL_TABLET | ORAL | Status: DC | PRN

## 2012-01-26 MED ORDER — SODIUM CHLORIDE 0.9 % IR SOLN
Status: DC | PRN
  Administered 2012-01-26: 1000 mL

## 2012-01-26 MED ORDER — HEPARIN SODIUM (PORCINE) 5000 UNIT/ML IJ SOLN
5000.0000 [IU] | Freq: Once | INTRAMUSCULAR | Status: AC
  Administered 2012-01-26: 5000 [IU] via SUBCUTANEOUS
  Filled 2012-01-26: qty 1

## 2012-01-26 MED ORDER — GLYCOPYRROLATE 0.2 MG/ML IJ SOLN
INTRAMUSCULAR | Status: DC | PRN
  Administered 2012-01-26: 0.6 mg via INTRAVENOUS

## 2012-01-26 MED ORDER — SUFENTANIL CITRATE 50 MCG/ML IV SOLN
INTRAVENOUS | Status: DC | PRN
  Administered 2012-01-26 (×6): 10 ug via INTRAVENOUS
  Administered 2012-01-26: 5 ug via INTRAVENOUS

## 2012-01-26 MED ORDER — HETASTARCH-ELECTROLYTES 6 % IV SOLN
INTRAVENOUS | Status: DC | PRN
  Administered 2012-01-26: 08:00:00 via INTRAVENOUS

## 2012-01-26 MED ORDER — KCL IN DEXTROSE-NACL 20-5-0.45 MEQ/L-%-% IV SOLN
INTRAVENOUS | Status: DC
  Administered 2012-01-26 – 2012-01-27 (×2): via INTRAVENOUS
  Filled 2012-01-26 (×4): qty 1000

## 2012-01-26 MED ORDER — CEFOXITIN SODIUM-DEXTROSE 1-4 GM-% IV SOLR (PREMIX)
INTRAVENOUS | Status: AC
  Filled 2012-01-26: qty 100

## 2012-01-26 MED ORDER — MEPERIDINE HCL 50 MG/ML IJ SOLN: 6.2500 mg | INTRAMUSCULAR | Status: DC | PRN

## 2012-01-26 MED ORDER — ONDANSETRON HCL 4 MG/2ML IJ SOLN: INTRAMUSCULAR | Status: DC | PRN

## 2012-01-26 MED ORDER — LIDOCAINE HCL (CARDIAC) 20 MG/ML IV SOLN
INTRAVENOUS | Status: DC | PRN
  Administered 2012-01-26: 40 mg via INTRAVENOUS

## 2012-01-26 MED ORDER — MIDAZOLAM HCL 5 MG/5ML IJ SOLN
INTRAMUSCULAR | Status: DC | PRN
  Administered 2012-01-26: 2 mg via INTRAVENOUS

## 2012-01-26 MED ORDER — ACETAMINOPHEN 10 MG/ML IV SOLN
INTRAVENOUS | Status: DC | PRN
  Administered 2012-01-26: 1000 mg via INTRAVENOUS

## 2012-01-26 MED ORDER — LIDOCAINE HCL 4 % MT SOLN
OROMUCOSAL | Status: DC | PRN
  Administered 2012-01-26: 4 mL via TOPICAL

## 2012-01-26 MED ORDER — NEOSTIGMINE METHYLSULFATE 1 MG/ML IJ SOLN
INTRAMUSCULAR | Status: DC | PRN
  Administered 2012-01-26: 4 mg via INTRAVENOUS

## 2012-01-26 MED ORDER — ROCURONIUM BROMIDE 100 MG/10ML IV SOLN
INTRAVENOUS | Status: DC | PRN
  Administered 2012-01-26: 20 mg via INTRAVENOUS
  Administered 2012-01-26: 10 mg via INTRAVENOUS
  Administered 2012-01-26: 5 mg via INTRAVENOUS
  Administered 2012-01-26: 45 mg via INTRAVENOUS

## 2012-01-26 MED ORDER — LACTATED RINGERS IV SOLN
INTRAVENOUS | Status: DC | PRN
  Administered 2012-01-26 (×3): via INTRAVENOUS

## 2012-01-26 MED ORDER — ONDANSETRON HCL 4 MG PO TABS: 4.0000 mg | ORAL_TABLET | Freq: Four times a day (QID) | ORAL | Status: DC | PRN

## 2012-01-26 MED ORDER — CHLORHEXIDINE GLUCONATE 4 % EX LIQD
1.0000 "application " | Freq: Once | CUTANEOUS | Status: DC
  Filled 2012-01-26: qty 15

## 2012-01-26 MED ORDER — SCOPOLAMINE 1 MG/3DAYS TD PT72
MEDICATED_PATCH | TRANSDERMAL | Status: AC
  Filled 2012-01-26: qty 1

## 2012-01-26 MED ORDER — HYDROMORPHONE HCL PF 1 MG/ML IJ SOLN: 0.2500 mg | INTRAMUSCULAR | Status: DC | PRN

## 2012-01-26 MED ORDER — BUPIVACAINE LIPOSOME 1.3 % IJ SUSP
20.0000 mL | Freq: Once | INTRAMUSCULAR | Status: AC
  Administered 2012-01-26: 20 mL
  Filled 2012-01-26: qty 20

## 2012-01-26 MED ORDER — LACTATED RINGERS IV SOLN: INTRAVENOUS | Status: DC

## 2012-01-26 MED ORDER — SCOPOLAMINE 1 MG/3DAYS TD PT72
MEDICATED_PATCH | TRANSDERMAL | Status: DC | PRN
  Administered 2012-01-26 (×2): 1 via TRANSDERMAL

## 2012-01-26 MED ORDER — HYDROMORPHONE HCL PF 1 MG/ML IJ SOLN
INTRAMUSCULAR | Status: DC | PRN
  Administered 2012-01-26: 1 mg via INTRAVENOUS
  Administered 2012-01-26: 2 mg via INTRAVENOUS

## 2012-01-26 MED ORDER — DEXTROSE 5 % IV SOLN
2.0000 g | INTRAVENOUS | Status: AC
  Administered 2012-01-26: 2 g via INTRAVENOUS
  Filled 2012-01-26: qty 2

## 2012-01-26 MED ORDER — LACTATED RINGERS IR SOLN
Status: DC | PRN
  Administered 2012-01-26: 1000 mL

## 2012-01-26 SURGICAL SUPPLY — 73 items
APPLIER CLIP 5 13 M/L LIGAMAX5 (MISCELLANEOUS)
APPLIER CLIP ROT 10 11.4 M/L (STAPLE) ×2
BLADE EXTENDED COATED 6.5IN (ELECTRODE) ×2 IMPLANT
BLADE HEX COATED 2.75 (ELECTRODE) ×2 IMPLANT
BLADE SURG SZ10 CARB STEEL (BLADE) ×2 IMPLANT
CABLE HIGH FREQUENCY MONO STRZ (ELECTRODE) ×2 IMPLANT
CANISTER SUCTION 2500CC (MISCELLANEOUS) ×2 IMPLANT
CELLS DAT CNTRL 66122 CELL SVR (MISCELLANEOUS) ×1 IMPLANT
CLIP APPLIE 5 13 M/L LIGAMAX5 (MISCELLANEOUS) IMPLANT
CLIP APPLIE ROT 10 11.4 M/L (STAPLE) ×1 IMPLANT
CLOTH BEACON ORANGE TIMEOUT ST (SAFETY) ×2 IMPLANT
COVER MAYO STAND STRL (DRAPES) ×2 IMPLANT
DECANTER SPIKE VIAL GLASS SM (MISCELLANEOUS) ×2 IMPLANT
DRAIN CHANNEL 19F RND (DRAIN) IMPLANT
DRAPE LAPAROSCOPIC ABDOMINAL (DRAPES) ×2 IMPLANT
DRAPE LG THREE QUARTER DISP (DRAPES) ×2 IMPLANT
DRAPE WARM FLUID 44X44 (DRAPE) ×4 IMPLANT
ELECT REM PT RETURN 9FT ADLT (ELECTROSURGICAL) ×2
ELECTRODE REM PT RTRN 9FT ADLT (ELECTROSURGICAL) ×1 IMPLANT
GLOVE BIOGEL M 8.0 STRL (GLOVE) ×4 IMPLANT
GLOVE BIOGEL PI IND STRL 7.0 (GLOVE) ×2 IMPLANT
GLOVE BIOGEL PI INDICATOR 7.0 (GLOVE) ×2
GOWN PREVENTION PLUS XXLARGE (GOWN DISPOSABLE) ×4 IMPLANT
GOWN STRL NON-REIN LRG LVL3 (GOWN DISPOSABLE) ×2 IMPLANT
GOWN STRL REIN XL XLG (GOWN DISPOSABLE) ×12 IMPLANT
GRASPER LAPSCPC 5X35 EPIX (ENDOMECHANICALS) IMPLANT
HAND ACTIVATED (MISCELLANEOUS) ×2 IMPLANT
KIT BASIN OR (CUSTOM PROCEDURE TRAY) ×2 IMPLANT
LEGGING LITHOTOMY PAIR STRL (DRAPES) IMPLANT
LIGASURE IMPACT 36 18CM CVD LR (INSTRUMENTS) IMPLANT
NS IRRIG 1000ML POUR BTL (IV SOLUTION) ×2 IMPLANT
PENCIL BUTTON HOLSTER BLD 10FT (ELECTRODE) ×2 IMPLANT
RELOAD PROXIMATE 75MM BLUE (ENDOMECHANICALS) ×4 IMPLANT
RTRCTR WOUND ALEXIS 18CM MED (MISCELLANEOUS) ×2
SCISSORS LAP 5X35 DISP (ENDOMECHANICALS) ×2 IMPLANT
SEALER TISSUE G2 CVD JAW 35 (ENDOMECHANICALS) IMPLANT
SEALER TISSUE G2 CVD JAW 45CM (ENDOMECHANICALS)
SET IRRIG TUBING LAPAROSCOPIC (IRRIGATION / IRRIGATOR) ×2 IMPLANT
SOLUTION ANTI FOG 6CC (MISCELLANEOUS) ×2 IMPLANT
SPONGE GAUZE 4X4 12PLY (GAUZE/BANDAGES/DRESSINGS) ×2 IMPLANT
SPONGE LAP 18X18 X RAY DECT (DISPOSABLE) ×4 IMPLANT
STAPLER PROXIMATE 75MM BLUE (STAPLE) ×2 IMPLANT
STAPLER VISISTAT 35W (STAPLE) ×2 IMPLANT
SUCTION POOLE TIP (SUCTIONS) ×2 IMPLANT
SUT NOVA NAB GS-21 1 T12 (SUTURE) ×2 IMPLANT
SUT PDS AB 0 CTX 36 PDP370T (SUTURE) ×4 IMPLANT
SUT PDS AB 1 CTX 36 (SUTURE) IMPLANT
SUT PDS AB 1 TP1 96 (SUTURE) IMPLANT
SUT PROLENE 2 0 KS (SUTURE) IMPLANT
SUT SILK 2 0 (SUTURE) ×1
SUT SILK 2 0 SH CR/8 (SUTURE) ×4 IMPLANT
SUT SILK 2-0 18XBRD TIE 12 (SUTURE) ×1 IMPLANT
SUT SILK 3 0 (SUTURE) ×1
SUT SILK 3 0 SH CR/8 (SUTURE) ×4 IMPLANT
SUT SILK 3-0 18XBRD TIE 12 (SUTURE) ×1 IMPLANT
SUT VIC AB 2-0 SH 18 (SUTURE) IMPLANT
SUT VIC AB 4-0 SH 18 (SUTURE) ×2 IMPLANT
SUT VICRYL 2 0 18  UND BR (SUTURE)
SUT VICRYL 2 0 18 UND BR (SUTURE) IMPLANT
SYR 30ML LL (SYRINGE) ×2 IMPLANT
SYS LAPSCP GELPORT 120MM (MISCELLANEOUS)
SYSTEM LAPSCP GELPORT 120MM (MISCELLANEOUS) IMPLANT
TOWEL OR 17X26 10 PK STRL BLUE (TOWEL DISPOSABLE) ×4 IMPLANT
TOWEL OR NON WOVEN STRL DISP B (DISPOSABLE) ×4 IMPLANT
TRAY FOLEY CATH 14FRSI W/METER (CATHETERS) ×2 IMPLANT
TRAY LAP CHOLE (CUSTOM PROCEDURE TRAY) ×2 IMPLANT
TROCAR XCEL BLADELESS 5X75MML (TROCAR) ×6 IMPLANT
TROCAR XCEL BLUNT TIP 100MML (ENDOMECHANICALS) IMPLANT
TROCAR XCEL NON-BLD 11X100MML (ENDOMECHANICALS) IMPLANT
TROCAR XCEL NON-BLD 5MMX100MML (ENDOMECHANICALS) ×6 IMPLANT
TUBING FILTER THERMOFLATOR (ELECTROSURGICAL) ×2 IMPLANT
YANKAUER SUCT BULB TIP 10FT TU (MISCELLANEOUS) ×2 IMPLANT
YANKAUER SUCT BULB TIP NO VENT (SUCTIONS) ×2 IMPLANT

## 2012-01-26 NOTE — Op Note (Signed)
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 Her abdomen was prepped with PCMX and draped sterilely. Access to the abdomen was achieved using a 0 of Optiview technique through the left upper quadrant. 2 fives were used below the umbilicus in the line of the future incision and another 5 mm was placed in the right upper quadrant.  Using the harmonic scalpel the right colon was mobilized taking the investments laterally to enable the right colon appendix and terminal ileum to be mobilized and brought to the midline. The transverse colon was adherent somewhat to the fundus the gallbladder and these adhesions were taken down as well. This was done with the harmonic scalpel. Once mobilization was achieved the midline incision was created below the umbilicus connecting the 25 mm ports creating about a 8 cm incision. This incision was carried down into the abdomen and a plexus wound protector was inserted. The right colon was then exteriorized without difficulty including the cystic mass at the base the appendix. I went ahead and divided the terminal ileum and found a spot in the proximal transverse colon and divided these with the GIA. Mesentery was divided using Kelly clamps the harmonic scalpel was oversewn with 2-0 silk suture ligatures. Specimen was removed and examined by me on the back table and there was no ulceration in within the cecum where this mass lay within the appendix. A functional end-to-end anastomosis created by aligning the antimesenteric border of the terminal ileum with the tenia of the transverse colon this was held in place with a single stitch of silk. Openings were created on both the small intestine and the colon and a GIA was inserted and  fired. Staple line was intact and was not bleeding. The common defect was closed in 2 layers with 4-0 PDS and with 3-0 silk's. Mesentery was approximated with 2-0 silk and everything was lying anatomically it should.  Changed gown cane changed gloves and remove the retractor. The midline incision was closed with a running 2-0 PDS on the peritoneal layer and interrupted #1 Novafils to the fascia anteriorly. Scope was then inserted inspected the closure as well as the bowel is lying in the right side and everything looked to be in order. No bleeding was seen. Port sites were injected as was the incisions were injected with Exparel and then were irrigated and closed with 4-0 Vicryl and with staples. Sterile Smith & Nephew dressing was applied to the incision the patient was taken recovery room in satisfactory condition.  Matt B. Daphine Deutscher, MD, [facility], [place] [phone]

## 2012-01-26 NOTE — Transfer of Care (Signed)
Immediate Anesthesia Transfer of Care Note  Patient: Pamela Morrow  Procedure(s) Performed: Procedure(s) (LRB) with comments: LAPAROSCOPIC PARTIAL COLECTOMY (N/A) - Lap Assisted Partial Colectomy  Patient Location: PACU  Anesthesia Type:General  Level of Consciousness: awake, alert , oriented and patient cooperative  Airway & Oxygen Therapy: Patient Spontanous Breathing and Patient connected to face mask oxygen  Post-op Assessment: Report given to PACU RN, Post -op Vital signs reviewed and stable and Patient moving all extremities X 4  Post vital signs: stable  Complications: No apparent anesthesia complications

## 2012-01-26 NOTE — Anesthesia Preprocedure Evaluation (Addendum)
Anesthesia Evaluation  Patient identified by MRN, date of birth, ID band Patient awake    Reviewed: Allergy & Precautions, H&P , NPO status , Patient's Chart, lab work & pertinent test results  Airway Mallampati: I TM Distance: >3 FB Neck ROM: Full    Dental No notable dental hx.    Pulmonary neg pulmonary ROS,  breath sounds clear to auscultation  Pulmonary exam normal       Cardiovascular negative cardio ROS  Rhythm:Regular Rate:Normal     Neuro/Psych negative neurological ROS  negative psych ROS   GI/Hepatic negative GI ROS, Neg liver ROS, hiatal hernia, GERD-  ,(+) Hepatitis - (as a teen)  Endo/Other  negative endocrine ROSHypothyroidism   Renal/GU negative Renal ROS  negative genitourinary   Musculoskeletal negative musculoskeletal ROS (+)   Abdominal   Peds negative pediatric ROS (+)  Hematology negative hematology ROS (+)   Anesthesia Other Findings   Reproductive/Obstetrics negative OB ROS                          Anesthesia Physical Anesthesia Plan  ASA: II  Anesthesia Plan: General   Post-op Pain Management:    Induction: Intravenous  Airway Management Planned: Oral ETT  Additional Equipment:   Intra-op Plan:   Post-operative Plan: Extubation in OR  Informed Consent: I have reviewed the patients History and Physical, chart, labs and discussed the procedure including the risks, benefits and alternatives for the proposed anesthesia with the patient or authorized representative who has indicated his/her understanding and acceptance.   Dental advisory given  Plan Discussed with: CRNA  Anesthesia Plan Comments:         Anesthesia Quick Evaluation

## 2012-01-26 NOTE — Interval H&P Note (Signed)
History and Physical Interval Note:  01/26/2012 7:34 AM  Pamela Morrow  has presented today for surgery, with the diagnosis of mass in cedum  The various methods of treatment have been discussed with the patient and family. After consideration of risks, benefits and other options for treatment, the patient has consented to  Procedure(s) (LRB) with comments: LAPAROSCOPIC PARTIAL COLECTOMY (N/A) - Lap Assisted Partial Colectomy as a surgical intervention .  The patient's history has been reviewed, patient examined, no change in status, stable for surgery.  I have reviewed the patient's chart and labs.  Questions were answered to the patient's satisfaction.     Pamela Morrow B

## 2012-01-26 NOTE — Progress Notes (Signed)
Patient assisted to the side of the bed to get up to the bathroom.  Patient slumped over onto the side of the bed with one nurse tech on one side and this nurse on the other side of the patient.  Patient assisted back to bed and patient unresponsive to voice. Bed placed in trendelenburg position. Patient awakened to sternal rub after 30 seconds.  Vital signs after patient awakened 90/60; 88 heart rate and 97% sats.  Patient reports pain of 6/ 10 in abdomen.  Incisions to abdomen clean and dry.

## 2012-01-26 NOTE — Progress Notes (Signed)
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  She had her foley removed about 11:30 today and has not voided since so they tried to get her up to void and she passed out.  BP down into the 90's. She was unresponsive for about 30 sec, woke up with sternal rub and lying back down.  BP now up to 101/72, rechecked after taking out of trendelenburg and she was stable, neuro exam was normal , she knew her birthday and could answer questions without difficulty.  I'm going to have them do a bladder scan, check labs, and give some extra fluids.  If she can't void I will leave a PRN foley order. CHECK ORTHOSTATIC BP before getting up again.

## 2012-01-26 NOTE — Anesthesia Postprocedure Evaluation (Signed)
  Anesthesia Post-op Note  Patient: Pamela Morrow  Procedure(s) Performed: Procedure(s) (LRB): LAPAROSCOPIC PARTIAL COLECTOMY (N/A)  Patient Location: PACU  Anesthesia Type: General  Level of Consciousness: awake and alert   Airway and Oxygen Therapy: Patient Spontanous Breathing  Post-op Pain: mild  Post-op Assessment: Post-op Vital signs reviewed, Patient's Cardiovascular Status Stable, Respiratory Function Stable, Patent Airway and No signs of Nausea or vomiting  Last Vitals:  Filed Vitals:   01/26/12 1214  BP: 121/77  Pulse: 71  Temp: 36.4 C  Resp: 14    Post-op Vital Signs: stable   Complications: No apparent anesthesia complications

## 2012-01-27 ENCOUNTER — Inpatient Hospital Stay (HOSPITAL_COMMUNITY): Payer: Medicare Other | Admitting: Anesthesiology

## 2012-01-27 ENCOUNTER — Encounter (HOSPITAL_COMMUNITY): Payer: Self-pay | Admitting: Anesthesiology

## 2012-01-27 ENCOUNTER — Encounter (HOSPITAL_COMMUNITY)
Admission: RE | Disposition: A | Discharge status: Home / Self Care | Payer: Self-pay | Source: Ambulatory Visit | Attending: Surgery

## 2012-01-27 DIAGNOSIS — D62 Acute posthemorrhagic anemia: Secondary | ICD-10-CM

## 2012-01-27 HISTORY — PX: UPPER GI ENDOSCOPY: SHX6162

## 2012-01-27 HISTORY — PX: LAPAROSCOPY: SHX197

## 2012-01-27 LAB — CBC
HCT: 21.9 % — ABNORMAL LOW (ref 36.0–46.0)
HCT: 20.5 % — ABNORMAL LOW (ref 36.0–46.0)
Hemoglobin: 7.7 g/dL — ABNORMAL LOW (ref 12.0–15.0)
MCH: 33.2 pg (ref 26.0–34.0)
MCHC: 35.5 g/dL (ref 30.0–36.0)
MCHC: 34.6 g/dL (ref 30.0–36.0)
MCV: 96.3 fL (ref 78.0–100.0)
MCV: 95.8 fL (ref 78.0–100.0)
Platelets: 250 10*3/uL (ref 150–400)
RBC: 2.29 MIL/uL — ABNORMAL LOW (ref 3.87–5.11)
RDW: 12.4 % (ref 11.5–15.5)
RDW: 12.6 % (ref 11.5–15.5)
WBC: 9.4 10*3/uL (ref 4.0–10.5)
WBC: 13.9 10*3/uL — ABNORMAL HIGH (ref 4.0–10.5)

## 2012-01-27 LAB — BASIC METABOLIC PANEL
Calcium: 7.9 mg/dL — ABNORMAL LOW (ref 8.4–10.5)
Chloride: 105 mEq/L (ref 96–112)
Creatinine, Ser: 0.71 mg/dL (ref 0.50–1.10)
GFR calc Af Amer: >90 mL/min (ref >90)
GFR calc non Af Amer: 89 mL/min — ABNORMAL LOW (ref >90)

## 2012-01-27 LAB — PREPARE RBC (CROSSMATCH)

## 2012-01-27 SURGERY — LAPAROSCOPY, DIAGNOSTIC
Anesthesia: General | Site: Esophagus | Wound class: Clean

## 2012-01-27 MED ORDER — KCL IN DEXTROSE-NACL 20-5-0.45 MEQ/L-%-% IV SOLN
INTRAVENOUS | Status: DC
  Administered 2012-01-27: 100 mL via INTRAVENOUS
  Administered 2012-01-28: 08:00:00 via INTRAVENOUS
  Administered 2012-01-28: 100 mL/h via INTRAVENOUS
  Administered 2012-01-29 – 2012-01-30 (×5): via INTRAVENOUS
  Filled 2012-01-27 (×10): qty 1000

## 2012-01-27 MED ORDER — NEOSTIGMINE METHYLSULFATE 1 MG/ML IJ SOLN
INTRAMUSCULAR | Status: DC | PRN
  Administered 2012-01-27: 4 mg via INTRAVENOUS

## 2012-01-27 MED ORDER — OXYCODONE HCL 5 MG PO TABS: 5.0000 mg | ORAL_TABLET | Freq: Once | ORAL | Status: DC | PRN

## 2012-01-27 MED ORDER — GLYCOPYRROLATE 0.2 MG/ML IJ SOLN
INTRAMUSCULAR | Status: DC | PRN
  Administered 2012-01-27: .8 mg via INTRAVENOUS

## 2012-01-27 MED ORDER — MORPHINE SULFATE 2 MG/ML IJ SOLN: 1.0000 mg | INTRAMUSCULAR | Status: DC | PRN

## 2012-01-27 MED ORDER — LACTATED RINGERS IV SOLN: INTRAVENOUS | Status: DC

## 2012-01-27 MED ORDER — ROCURONIUM BROMIDE 100 MG/10ML IV SOLN
INTRAVENOUS | Status: DC | PRN
  Administered 2012-01-27: 10 mg via INTRAVENOUS
  Administered 2012-01-27: 30 mg via INTRAVENOUS

## 2012-01-27 MED ORDER — DEXTROSE 5 % IV SOLN
2.0000 g | Freq: Four times a day (QID) | INTRAVENOUS | Status: DC
  Administered 2012-01-27: 2 g via INTRAVENOUS
  Filled 2012-01-27 (×8): qty 2

## 2012-01-27 MED ORDER — PHENYLEPHRINE HCL 10 MG/ML IJ SOLN
INTRAMUSCULAR | Status: DC | PRN
  Administered 2012-01-27 (×2): 80 ug via INTRAVENOUS

## 2012-01-27 MED ORDER — ACETAMINOPHEN 10 MG/ML IV SOLN: 1000.0000 mg | Freq: Once | INTRAVENOUS | Status: DC | PRN

## 2012-01-27 MED ORDER — PROMETHAZINE HCL 25 MG/ML IJ SOLN: 6.2500 mg | INTRAMUSCULAR | Status: DC | PRN

## 2012-01-27 MED ORDER — PANTOPRAZOLE SODIUM 40 MG IV SOLR
40.0000 mg | Freq: Once | INTRAVENOUS | Status: AC
  Administered 2012-01-27: 40 mg via INTRAVENOUS
  Filled 2012-01-27: qty 40

## 2012-01-27 MED ORDER — LIDOCAINE HCL (CARDIAC) 20 MG/ML IV SOLN
INTRAVENOUS | Status: DC | PRN
  Administered 2012-01-27: 50 mg via INTRAVENOUS

## 2012-01-27 MED ORDER — OXYCODONE HCL 5 MG/5ML PO SOLN
5.0000 mg | Freq: Once | ORAL | Status: DC | PRN
  Filled 2012-01-27: qty 5

## 2012-01-27 MED ORDER — SODIUM CHLORIDE 0.9 % IV SOLN
8.0000 mg/h | INTRAVENOUS | Status: DC
  Administered 2012-01-27 – 2012-01-31 (×9): 8 mg/h via INTRAVENOUS
  Filled 2012-01-27 (×23): qty 80

## 2012-01-27 MED ORDER — ONDANSETRON HCL 4 MG PO TABS: 4.0000 mg | ORAL_TABLET | Freq: Four times a day (QID) | ORAL | Status: DC | PRN

## 2012-01-27 MED ORDER — ONDANSETRON HCL 4 MG/2ML IJ SOLN: 4.0000 mg | Freq: Four times a day (QID) | INTRAMUSCULAR | Status: DC | PRN

## 2012-01-27 MED ORDER — CEFOXITIN SODIUM-DEXTROSE 1-4 GM-% IV SOLR (PREMIX)
INTRAVENOUS | Status: AC
  Filled 2012-01-27: qty 100

## 2012-01-27 MED ORDER — PROPOFOL 10 MG/ML IV BOLUS
INTRAVENOUS | Status: DC | PRN
  Administered 2012-01-27: 150 mg via INTRAVENOUS

## 2012-01-27 MED ORDER — LACTATED RINGERS IR SOLN
Status: DC | PRN
  Administered 2012-01-27: 1

## 2012-01-27 MED ORDER — BUPIVACAINE-EPINEPHRINE PF 0.25-1:200000 % IJ SOLN
INTRAMUSCULAR | Status: AC
  Filled 2012-01-27: qty 30

## 2012-01-27 MED ORDER — LACTATED RINGERS IV SOLN
INTRAVENOUS | Status: DC | PRN
  Administered 2012-01-27 (×2): via INTRAVENOUS

## 2012-01-27 MED ORDER — HYDROMORPHONE HCL PF 1 MG/ML IJ SOLN: 0.2500 mg | INTRAMUSCULAR | Status: DC | PRN

## 2012-01-27 MED ORDER — FENTANYL CITRATE 0.05 MG/ML IJ SOLN
INTRAMUSCULAR | Status: DC | PRN
  Administered 2012-01-27: 50 ug via INTRAVENOUS
  Administered 2012-01-27: 100 ug via INTRAVENOUS
  Administered 2012-01-27 (×2): 50 ug via INTRAVENOUS

## 2012-01-27 MED ORDER — MEPERIDINE HCL 50 MG/ML IJ SOLN: 6.2500 mg | INTRAMUSCULAR | Status: DC | PRN

## 2012-01-27 SURGICAL SUPPLY — 31 items
BENZOIN TINCTURE PRP APPL 2/3 (GAUZE/BANDAGES/DRESSINGS) ×3 IMPLANT
CANISTER SUCTION 2500CC (MISCELLANEOUS) ×6 IMPLANT
CLOTH BEACON ORANGE TIMEOUT ST (SAFETY) ×3 IMPLANT
COVER SURGICAL LIGHT HANDLE (MISCELLANEOUS) IMPLANT
DECANTER SPIKE VIAL GLASS SM (MISCELLANEOUS) ×3 IMPLANT
DRAPE LAPAROSCOPIC ABDOMINAL (DRAPES) ×3 IMPLANT
ELECT REM PT RETURN 9FT ADLT (ELECTROSURGICAL) ×3
ELECTRODE REM PT RTRN 9FT ADLT (ELECTROSURGICAL) ×2 IMPLANT
GLOVE BIOGEL M 8.0 STRL (GLOVE) ×3 IMPLANT
GLOVE BIOGEL PI IND STRL 7.0 (GLOVE) ×2 IMPLANT
GLOVE BIOGEL PI INDICATOR 7.0 (GLOVE) ×1
GOWN STRL NON-REIN LRG LVL3 (GOWN DISPOSABLE) ×3 IMPLANT
GOWN STRL REIN XL XLG (GOWN DISPOSABLE) ×15 IMPLANT
HAND ACTIVATED (MISCELLANEOUS) IMPLANT
KIT BASIN OR (CUSTOM PROCEDURE TRAY) ×3 IMPLANT
SET IRRIG TUBING LAPAROSCOPIC (IRRIGATION / IRRIGATOR) ×3 IMPLANT
SLEEVE Z-THREAD 5X100MM (TROCAR) IMPLANT
SOLUTION ANTI FOG 6CC (MISCELLANEOUS) ×3 IMPLANT
STRIP CLOSURE SKIN 1/2X4 (GAUZE/BANDAGES/DRESSINGS) IMPLANT
SUT VIC AB 4-0 SH 18 (SUTURE) ×3 IMPLANT
SYR 30ML LL (SYRINGE) IMPLANT
SYR 50ML LL SCALE MARK (SYRINGE) ×3 IMPLANT
TRAY FOLEY CATH 14FRSI W/METER (CATHETERS) IMPLANT
TRAY LAP CHOLE (CUSTOM PROCEDURE TRAY) ×3 IMPLANT
TROCAR HASSON GELL 12X100 (TROCAR) IMPLANT
TROCAR Z-THREAD FIOS 11X100 BL (TROCAR) IMPLANT
TROCAR Z-THREAD FIOS 5X100MM (TROCAR) IMPLANT
TROCAR Z-THREAD SLEEVE 11X100 (TROCAR) IMPLANT
TUBING CONNECTING 10 (TUBING) ×3 IMPLANT
TUBING ENDO SMARTCAP (MISCELLANEOUS) ×3 IMPLANT
TUBING INSUFFLATION 10FT LAP (TUBING) ×3 IMPLANT

## 2012-01-27 NOTE — Brief Op Note (Signed)
01/26/2012 - 01/27/2012  12:23 PM  PATIENT:  Matt Holmes  67 y.o. female  PRE-OPERATIVE DIAGNOSIS:  Post operative gastrointestional bleeding  POST-OPERATIVE DIAGNOSIS:  Upper gastrointestional bleed  PROCEDURE:  Procedure(s) (LRB) with comments: LAPAROSCOPY DIAGNOSTIC (N/A) UPPER GI ENDOSCOPY (N/A)  SURGEON:  Surgeon(s) and Role:    * Valarie Merino, MD - Primary  PHYSICIAN ASSISTANT:   ASSISTANTS: none   ANESTHESIA:   general  EBL:  Total I/O In: 1100 [I.V.:1100] Out: 100 [Urine:100]  BLOOD ADMINISTERED:none  DRAINS: none   LOCAL MEDICATIONS USED:  NONE  SPECIMEN:  No Specimen  DISPOSITION OF SPECIMEN:  N/A  COUNTS:  YES  TOURNIQUET:  * No tourniquets in log *  DICTATION: .Other Dictation: Dictation Number [id number]  PLAN OF CARE: Going back to stepdown  PATIENT DISPOSITION:  PACU - hemodynamically stable.   Delay start of Pharmacological VTE agent (>24hrs) due to surgical blood loss or risk of bleeding: yes

## 2012-01-27 NOTE — Progress Notes (Signed)
Patient experienced episode of unresponsiveness when the NT was assisting her to stand at the bedside.  Patient was assisted back to bed and regained consciousness in just about 10seconds.  BP prior to standing up 99/63 Pulse 96. After the episode BP 102/67 Pulse 96.  Dr Abbey Chatters was notified. Bedrest ordered and stated that he will further evaluate patient this morning.

## 2012-01-27 NOTE — Transfer of Care (Signed)
Immediate Anesthesia Transfer of Care Note  Patient: Pamela Morrow  Procedure(s) Performed: Procedure(s) (LRB): LAPAROSCOPY DIAGNOSTIC (N/A) UPPER GI ENDOSCOPY (N/A)  Patient Location: PACU  Anesthesia Type: General  Level of Consciousness: sedated, patient cooperative and responds to stimulaton  Airway & Oxygen Therapy: Patient Spontanous Breathing and Patient connected to face mask oxgen  Post-op Assessment: Report given to PACU RN and Post -op Vital signs reviewed and stable  Post vital signs: Reviewed and stable  Complications: No apparent anesthesia complications

## 2012-01-27 NOTE — Op Note (Signed)
NAMEINZA, MIKRUT NO.:  [id number]  MEDICAL RECORD NO.:  [id number]  LOCATION:  1240                         FACILITY:  [facility]  PHYSICIAN:  Thornton Park. Daphine Deutscher, MD  DATE OF BIRTH:  01-01-1946  DATE OF PROCEDURE:  01/27/2012 DATE OF DISCHARGE:                              OPERATIVE REPORT   PREOPERATIVE DIAGNOSIS:  Acute blood loss following lap assisted right hemicolectomy.  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  PROCEDURE:  Laparoscopy and upper endoscopy.  SURGEON:  Thornton Park. Daphine Deutscher, MD  ANESTHESIA:  General endotracheal.  PROCEDURE:  Ms. Hattabaugh was taken to room #1 on Saturday morning, 01/27/2011, one day postop.  Following her surgery, she had a hypotensive episode yesterday and again during the evening.  Serial hemoglobin showed a drop from about 11+ to 7.7.  It was felt that intra- abdominal bleeding needed to be ruled out.  The patient had not passed any blood per rectum or vomited.  Following induction of general anesthesia, the abdomen was prepped with PCMX and the staples were removed from her previous incisions. Incisions looked good and there was minimal ecchymoses.  Following a time-out, I went ahead and reintroduced the Optiview through the left upper port going in the previous trach without difficulty.  The abdomen was insufflated and an NG tube was being passed by Anesthesia and noted bloody drainage in the NG tube.  At the same time, I could see multiple distended loops of bowel that had dark material consistent with blood, but there was no intraperitoneal blood to speak off.  There was nothing with clots and certainly nothing to explain the loss in hemoglobin.  I left the trocars in place and removed the scope from the abdomen, and then went up top and performed upper endoscopy.  The endoscope was passed per mouth and passed into the esophagus, and was passed under direct vision through the  stomach and into the duodenum.  Across the pylorus, I surveyed the duodenal bulb and saw no bleeding.  Entering the stomach, I saw a lot of old coffee-ground material and dark heme-positive material.  Upon retroflexing, I could see at the EG junction where she has a hiatal hernia.  There were tears at the EG junction consistent with Mallory-Weiss tears and there was old blood pooled posteriorly.  I aspirated most of that and irrigated this. This was not actively bleeding.  I then took some pictures and decompressed the stomach and withdrew the scope.  I then went back with fresh gowns and closed, and surveyed the abdomen again, aspirating white fluid that had been collected.  I did not disturb the anastomosis which was everything else appeared to be in order.  I did then decompress the abdomen.  I irrigated the midline incision which was still closed with 4- 0 Vicryl just with irrigation and there was no bleeding or clot noted.  Everything looked very good from the inside where I had closed the peritoneum.  I then closed the wounds with staples and sterile dressings.  The patient was taken to the recovery room in satisfactory condition.  She will be transported back to the step-down unit for observation.  Thornton Park Daphine Deutscher, MD     MBM/MEDQ  D:  01/27/2012  T:  01/27/2012  Job:  [id number]

## 2012-01-27 NOTE — Progress Notes (Signed)
Report called to [facility] rn and pt moved to room 1240 per order. Pt husband with.

## 2012-01-27 NOTE — Progress Notes (Signed)
Patient ID: Pamela Morrow, female   DOB: 05-28-1945, 67 y.o.   MRN: [id number] [facility] Progress Note:   1 Day Post-Op  Subjective: Mental status is clear.  No pain or distress Objective: Vital signs in last 24 hours: Temp:  [97.4 F (36.3 C)-98.9 F (37.2 C)] 98.9 F (37.2 C) (01/11 0505) Pulse Rate:  [71-108] 96  (01/11 0505) Resp:  [10-18] 18  (01/11 0505) BP: (90-142)/(60-82) 102/67 mmHg (01/11 0505) SpO2:  [95 %-100 %] 96 % (01/11 0505) Weight:  [153 lb (69.4 kg)] 153 lb (69.4 kg) (01/10 1959)  Intake/Output from previous day: 01/10 0701 - 01/11 0700 In: 4620.8 [I.V.:4075; IV Piggyback:545.8] Out: 1700 [Urine:1650; Blood:50] Intake/Output this shift:    Physical Exam: Work of breathing is normal.  Abdomen is nondistended and soft.  No bloody BMs  Lab Results:  Results for orders placed during the hospital encounter of 01/26/12 (from the past 48 hour(s))  TYPE AND SCREEN     Status: Normal   Collection Time   01/26/12  6:00 AM      Component Value Range Comment   ABO/RH(D) O NEG      Antibody Screen NEG      Sample Expiration 01/29/2012     ABO/RH     Status: Normal   Collection Time   01/26/12  7:00 AM      Component Value Range Comment   ABO/RH(D) O NEG     CBC     Status: Abnormal   Collection Time   01/26/12 12:55 PM      Component Value Range Comment   WBC 13.9 (*) 4.0 - 10.5 K/uL    RBC 3.36 (*) 3.87 - 5.11 MIL/uL    Hemoglobin 11.1 (*) 12.0 - 15.0 g/dL    HCT 14.7 (*) 82.9 - 46.0 %    MCV 94.3  78.0 - 100.0 fL    MCH 33.0  26.0 - 34.0 pg    MCHC 35.0  30.0 - 36.0 g/dL    RDW 56.2  13.0 - 86.5 %    Platelets 251  150 - 400 K/uL   CREATININE, SERUM     Status: Normal   Collection Time   01/26/12 12:55 PM      Component Value Range Comment   Creatinine, Ser 0.62  0.50 - 1.10 mg/dL    GFR calc non Af Amer >90  >90 mL/min    GFR calc Af Amer >90  >90 mL/min   CBC     Status: Abnormal   Collection Time   01/26/12  5:17 PM      Component  Value Range Comment   WBC 13.4 (*) 4.0 - 10.5 K/uL    RBC 3.07 (*) 3.87 - 5.11 MIL/uL    Hemoglobin 10.2 (*) 12.0 - 15.0 g/dL    HCT 78.4 (*) 69.6 - 46.0 %    MCV 94.8  78.0 - 100.0 fL    MCH 33.2  26.0 - 34.0 pg    MCHC 35.1  30.0 - 36.0 g/dL    RDW 29.5  28.4 - 13.2 %    Platelets 255  150 - 400 K/uL   BASIC METABOLIC PANEL     Status: Abnormal   Collection Time   01/26/12  5:17 PM      Component Value Range Comment   Sodium 134 (*) 135 - 145 mEq/L    Potassium 3.6  3.5 - 5.1 mEq/L    Chloride 102  96 -  112 mEq/L    CO2 22  19 - 32 mEq/L    Glucose, Bld 254 (*) 70 - 99 mg/dL    BUN 9  6 - 23 mg/dL    Creatinine, Ser 8.29  0.50 - 1.10 mg/dL    Calcium 7.7 (*) 8.4 - 10.5 mg/dL    GFR calc non Af Amer >90  >90 mL/min    GFR calc Af Amer >90  >90 mL/min   CBC     Status: Abnormal   Collection Time   01/27/12  4:30 AM      Component Value Range Comment   WBC 9.4  4.0 - 10.5 K/uL    RBC 2.43 (*) 3.87 - 5.11 MIL/uL    Hemoglobin 8.3 (*) 12.0 - 15.0 g/dL    HCT 56.2 (*) 13.0 - 46.0 %    MCV 96.3  78.0 - 100.0 fL    MCH 34.2 (*) 26.0 - 34.0 pg    MCHC 35.5  30.0 - 36.0 g/dL    RDW 86.5  78.4 - 69.6 %    Platelets 250  150 - 400 K/uL   BASIC METABOLIC PANEL     Status: Abnormal   Collection Time   01/27/12  4:30 AM      Component Value Range Comment   Sodium 135  135 - 145 mEq/L    Potassium 4.1  3.5 - 5.1 mEq/L    Chloride 105  96 - 112 mEq/L    CO2 23  19 - 32 mEq/L    Glucose, Bld 195 (*) 70 - 99 mg/dL    BUN 10  6 - 23 mg/dL    Creatinine, Ser 2.95  0.50 - 1.10 mg/dL    Calcium 7.9 (*) 8.4 - 10.5 mg/dL    GFR calc non Af Amer 89 (*) >90 mL/min    GFR calc Af Amer >90  >90 mL/min   CBC     Status: Abnormal   Collection Time   01/27/12  9:11 AM      Component Value Range Comment   WBC 13.9 (*) 4.0 - 10.5 K/uL    RBC 2.29 (*) 3.87 - 5.11 MIL/uL    Hemoglobin 7.7 (*) 12.0 - 15.0 g/dL    HCT 28.4 (*) 13.2 - 46.0 %    MCV 95.6  78.0 - 100.0 fL    MCH 33.6  26.0 - 34.0 pg     MCHC 35.2  30.0 - 36.0 g/dL    RDW 44.0  10.2 - 72.5 %    Platelets 236  150 - 400 K/uL     Radiology/Results: No results found.  Anti-infectives: Anti-infectives     Start     Dose/Rate Route Frequency Ordered Stop   01/26/12 0536   cefOXitin (MEFOXIN) 2 g in dextrose 5 % 50 mL IVPB        2 g 100 mL/hr over 30 Minutes Intravenous On call to O.R. 01/26/12 0536 01/26/12 0744          Assessment/Plan: Problem List: Patient Active Problem List  Diagnosis  . Hypothyroidism  . Encounter for screening colonoscopy  . GERD (gastroesophageal reflux disease)  . Appendiceal tumor    Postop bleed.  Last dose of heparin was at 0600 today.  OR occupied at present.  Will transfer to stepdown and recheck Hg at 0830--4 hours from prior Hg.  Will consider return to OR to laparoscopy/laparotomy.   1 Day Post-Op    LOS: 1 day  Matt B. Daphine Deutscher, MD, [facility] [phone] beeper [phone]  01/27/2012 7:43 AM Repeat Hg is 7.7 demonstrating further drop in 5 hours.  Will take back to OR to seek source of bleeding with laparoscopy and possilble laparotomy

## 2012-01-27 NOTE — Anesthesia Postprocedure Evaluation (Signed)
Anesthesia Post Note  Patient: Pamela Morrow  Procedure(s) Performed: Procedure(s) (LRB): LAPAROSCOPY DIAGNOSTIC (N/A) UPPER GI ENDOSCOPY (N/A)  Anesthesia type: General  Patient location: PACU  Post pain: Pain level controlled  Post assessment: Post-op Vital signs reviewed  Last Vitals: BP 110/60  Pulse 98  Temp 37.1 C (Oral)  Resp 18  Ht 5\' 6"  (1.676 m)  Wt 160 lb (72.576 kg)  BMI 25.82 kg/m2  SpO2 99%  Post vital signs: Reviewed  Level of consciousness: sedated  Complications: No apparent anesthesia complications

## 2012-01-27 NOTE — Anesthesia Preprocedure Evaluation (Signed)
Anesthesia Evaluation  Patient identified by MRN, date of birth, ID band Patient awake    Reviewed: Allergy & Precautions, H&P , NPO status , Patient's Chart, lab work & pertinent test results  Airway Mallampati: I TM Distance: >3 FB Neck ROM: Full    Dental No notable dental hx. (+) Dental Advisory Given   Pulmonary neg pulmonary ROS,  breath sounds clear to auscultation  Pulmonary exam normal       Cardiovascular - CAD and - CHF negative cardio ROS  Rhythm:Regular Rate:Normal     Neuro/Psych negative neurological ROS  negative psych ROS   GI/Hepatic negative GI ROS, Neg liver ROS, hiatal hernia, GERD-  Medicated,(+) Hepatitis - (as a teen), Unspecified  Endo/Other  negative endocrine ROSHypothyroidism   Renal/GU negative Renal ROS  negative genitourinary   Musculoskeletal negative musculoskeletal ROS (+)   Abdominal   Peds negative pediatric ROS (+)  Hematology negative hematology ROS (+)   Anesthesia Other Findings   Reproductive/Obstetrics negative OB ROS                           Anesthesia Physical  Anesthesia Plan  ASA: II and emergent  Anesthesia Plan: General   Post-op Pain Management:    Induction: Intravenous and Rapid sequence  Airway Management Planned: Oral ETT  Additional Equipment:   Intra-op Plan:   Post-operative Plan: Extubation in OR  Informed Consent: I have reviewed the patients History and Physical, chart, labs and discussed the procedure including the risks, benefits and alternatives for the proposed anesthesia with the patient or authorized representative who has indicated his/her understanding and acceptance.   Dental advisory given  Plan Discussed with: CRNA  Anesthesia Plan Comments:         Anesthesia Quick Evaluation

## 2012-01-28 LAB — CBC
HCT: 22.9 % — ABNORMAL LOW (ref 36.0–46.0)
Hemoglobin: 7.9 g/dL — ABNORMAL LOW (ref 12.0–15.0)
MCH: 33 pg (ref 26.0–34.0)
MCH: 32.6 pg (ref 26.0–34.0)
MCHC: 35 g/dL (ref 30.0–36.0)
MCV: 94.6 fL (ref 78.0–100.0)
Platelets: 187 10*3/uL (ref 150–400)
Platelets: 188 10*3/uL (ref 150–400)
RBC: 2.33 MIL/uL — ABNORMAL LOW (ref 3.87–5.11)
RBC: 2.42 MIL/uL — ABNORMAL LOW (ref 3.87–5.11)
RDW: 14.2 % (ref 11.5–15.5)
WBC: 11.1 10*3/uL — ABNORMAL HIGH (ref 4.0–10.5)

## 2012-01-28 LAB — BASIC METABOLIC PANEL
CO2: 24 mEq/L (ref 19–32)
Calcium: 7.8 mg/dL — ABNORMAL LOW (ref 8.4–10.5)
Chloride: 105 mEq/L (ref 96–112)
Glucose, Bld: 132 mg/dL — ABNORMAL HIGH (ref 70–99)
Potassium: 4.1 mEq/L (ref 3.5–5.1)
Sodium: 135 mEq/L (ref 135–145)

## 2012-01-28 NOTE — Progress Notes (Signed)
Patient ID: Pamela Morrow, female   DOB: 1945/03/26, 67 y.o.   MRN: [id number] [facility] Progress Note:   1 Day Post-Op  Subjective: Mental status is clear Objective: Vital signs in last 24 hours: Temp:  [98.1 F (36.7 C)-99.8 F (37.7 C)] 98.1 F (36.7 C) (01/12 0400) Pulse Rate:  [71-107] 75  (01/12 0500) Resp:  [0-19] 0  (01/12 0500) BP: (92-134)/(50-82) 93/54 mmHg (01/12 0500) SpO2:  [95 %-100 %] 96 % (01/12 0500) Weight:  [160 lb (72.576 kg)] 160 lb (72.576 kg) (01/11 0830)  Intake/Output from previous day: 01/11 0701 - 01/12 0700 In: 1920 [I.V.:1908; Blood:12] Out: 2025 [Urine:2025] Intake/Output this shift:    Physical Exam: Work of breathing is normal.  No BM.  No abdominal pain  Lab Results:  Results for orders placed during the hospital encounter of 01/26/12 (from the past 48 hour(s))  CBC     Status: Abnormal   Collection Time   01/26/12 12:55 PM      Component Value Range Comment   WBC 13.9 (*) 4.0 - 10.5 K/uL    RBC 3.36 (*) 3.87 - 5.11 MIL/uL    Hemoglobin 11.1 (*) 12.0 - 15.0 g/dL    HCT 95.6 (*) 21.3 - 46.0 %    MCV 94.3  78.0 - 100.0 fL    MCH 33.0  26.0 - 34.0 pg    MCHC 35.0  30.0 - 36.0 g/dL    RDW 08.6  57.8 - 46.9 %    Platelets 251  150 - 400 K/uL   CREATININE, SERUM     Status: Normal   Collection Time   01/26/12 12:55 PM      Component Value Range Comment   Creatinine, Ser 0.62  0.50 - 1.10 mg/dL    GFR calc non Af Amer >90  >90 mL/min    GFR calc Af Amer >90  >90 mL/min   CBC     Status: Abnormal   Collection Time   01/26/12  5:17 PM      Component Value Range Comment   WBC 13.4 (*) 4.0 - 10.5 K/uL    RBC 3.07 (*) 3.87 - 5.11 MIL/uL    Hemoglobin 10.2 (*) 12.0 - 15.0 g/dL    HCT 62.9 (*) 52.8 - 46.0 %    MCV 94.8  78.0 - 100.0 fL    MCH 33.2  26.0 - 34.0 pg    MCHC 35.1  30.0 - 36.0 g/dL    RDW 41.3  24.4 - 01.0 %    Platelets 255  150 - 400 K/uL   BASIC METABOLIC PANEL     Status: Abnormal   Collection Time   01/26/12  5:17 PM      Component Value Range Comment   Sodium 134 (*) 135 - 145 mEq/L    Potassium 3.6  3.5 - 5.1 mEq/L    Chloride 102  96 - 112 mEq/L    CO2 22  19 - 32 mEq/L    Glucose, Bld 254 (*) 70 - 99 mg/dL    BUN 9  6 - 23 mg/dL    Creatinine, Ser 2.72  0.50 - 1.10 mg/dL    Calcium 7.7 (*) 8.4 - 10.5 mg/dL    GFR calc non Af Amer >90  >90 mL/min    GFR calc Af Amer >90  >90 mL/min   CBC     Status: Abnormal   Collection Time   01/27/12  4:30 AM  Component Value Range Comment   WBC 9.4  4.0 - 10.5 K/uL    RBC 2.43 (*) 3.87 - 5.11 MIL/uL    Hemoglobin 8.3 (*) 12.0 - 15.0 g/dL    HCT 14.7 (*) 82.9 - 46.0 %    MCV 96.3  78.0 - 100.0 fL    MCH 34.2 (*) 26.0 - 34.0 pg    MCHC 35.5  30.0 - 36.0 g/dL    RDW 56.2  13.0 - 86.5 %    Platelets 250  150 - 400 K/uL   BASIC METABOLIC PANEL     Status: Abnormal   Collection Time   01/27/12  4:30 AM      Component Value Range Comment   Sodium 135  135 - 145 mEq/L    Potassium 4.1  3.5 - 5.1 mEq/L    Chloride 105  96 - 112 mEq/L    CO2 23  19 - 32 mEq/L    Glucose, Bld 195 (*) 70 - 99 mg/dL    BUN 10  6 - 23 mg/dL    Creatinine, Ser 7.84  0.50 - 1.10 mg/dL    Calcium 7.9 (*) 8.4 - 10.5 mg/dL    GFR calc non Af Amer 89 (*) >90 mL/min    GFR calc Af Amer >90  >90 mL/min   CBC     Status: Abnormal   Collection Time   01/27/12  9:11 AM      Component Value Range Comment   WBC 13.9 (*) 4.0 - 10.5 K/uL    RBC 2.29 (*) 3.87 - 5.11 MIL/uL    Hemoglobin 7.7 (*) 12.0 - 15.0 g/dL    HCT 69.6 (*) 29.5 - 46.0 %    MCV 95.6  78.0 - 100.0 fL    MCH 33.6  26.0 - 34.0 pg    MCHC 35.2  30.0 - 36.0 g/dL    RDW 28.4  13.2 - 44.0 %    Platelets 236  150 - 400 K/uL   PREPARE RBC (CROSSMATCH)     Status: Normal   Collection Time   01/27/12  9:51 AM      Component Value Range Comment   Order Confirmation ORDER PROCESSED BY BLOOD BANK     CBC     Status: Abnormal   Collection Time   01/27/12  5:52 PM      Component Value Range Comment   WBC  17.6 (*) 4.0 - 10.5 K/uL    RBC 2.14 (*) 3.87 - 5.11 MIL/uL    Hemoglobin 7.1 (*) 12.0 - 15.0 g/dL    HCT 10.2 (*) 72.5 - 46.0 %    MCV 95.8  78.0 - 100.0 fL    MCH 33.2  26.0 - 34.0 pg    MCHC 34.6  30.0 - 36.0 g/dL    RDW 36.6  44.0 - 34.7 %    Platelets 213  150 - 400 K/uL   CBC     Status: Abnormal   Collection Time   01/28/12  3:37 AM      Component Value Range Comment   WBC 13.9 (*) 4.0 - 10.5 K/uL    RBC 2.33 (*) 3.87 - 5.11 MIL/uL    Hemoglobin 7.7 (*) 12.0 - 15.0 g/dL    HCT 42.5 (*) 95.6 - 46.0 %    MCV 94.4  78.0 - 100.0 fL    MCH 33.0  26.0 - 34.0 pg    MCHC 35.0  30.0 - 36.0 g/dL  RDW 14.2  11.5 - 15.5 %    Platelets 187  150 - 400 K/uL   BASIC METABOLIC PANEL     Status: Abnormal   Collection Time   01/28/12  3:37 AM      Component Value Range Comment   Sodium 135  135 - 145 mEq/L    Potassium 4.1  3.5 - 5.1 mEq/L    Chloride 105  96 - 112 mEq/L    CO2 24  19 - 32 mEq/L    Glucose, Bld 132 (*) 70 - 99 mg/dL    BUN 9  6 - 23 mg/dL    Creatinine, Ser 9.60  0.50 - 1.10 mg/dL    Calcium 7.8 (*) 8.4 - 10.5 mg/dL    GFR calc non Af Amer 90 (*) >90 mL/min    GFR calc Af Amer >90  >90 mL/min     Radiology/Results: No results found.  Anti-infectives: Anti-infectives     Start     Dose/Rate Route Frequency Ordered Stop   01/27/12 1200   cefOXitin (MEFOXIN) 2 g in dextrose 5 % 50 mL IVPB  Status:  Discontinued        2 g 100 mL/hr over 30 Minutes Intravenous 4 times per day 01/27/12 1038 01/27/12 1300   01/26/12 0536   cefOXitin (MEFOXIN) 2 g in dextrose 5 % 50 mL IVPB        2 g 100 mL/hr over 30 Minutes Intravenous On call to O.R. 01/26/12 0536 01/26/12 0744          Assessment/Plan: Problem List: Patient Active Problem List  Diagnosis  . Hypothyroidism  . Encounter for screening colonoscopy  . GERD (gastroesophageal reflux disease)  . Appendiceal tumor    Hg rose from 7.1 back to 7.7 after 1 unit.  Will get up and dangle.  Recheck hg tonight at  6 pm.  1 Day Post-Op    LOS: 2 days   Matt B. Daphine Deutscher, MD, [facility] [phone] beeper [phone]  01/28/2012 7:58 AM

## 2012-01-29 ENCOUNTER — Encounter (HOSPITAL_COMMUNITY): Payer: Self-pay | Admitting: Surgery

## 2012-01-29 LAB — BASIC METABOLIC PANEL
CO2: 23 mEq/L (ref 19–32)
Calcium: 7.9 mg/dL — ABNORMAL LOW (ref 8.4–10.5)
Chloride: 104 mEq/L (ref 96–112)
Creatinine, Ser: 0.69 mg/dL (ref 0.50–1.10)
Glucose, Bld: 105 mg/dL — ABNORMAL HIGH (ref 70–99)

## 2012-01-29 LAB — TYPE AND SCREEN
ABO/RH(D): O NEG
Unit division: 0
Unit division: 0

## 2012-01-29 LAB — CBC WITH DIFFERENTIAL/PLATELET
Basophils Absolute: 0 10*3/uL (ref 0.0–0.1)
Eosinophils Absolute: 0 10*3/uL (ref 0.0–0.7)
Eosinophils Relative: 0 % (ref 0–5)
HCT: 22.7 % — ABNORMAL LOW (ref 36.0–46.0)
Lymphocytes Relative: 31 % (ref 12–46)
Lymphs Abs: 2.7 10*3/uL (ref 0.7–4.0)
MCH: 32.5 pg (ref 26.0–34.0)
MCV: 97 fL (ref 78.0–100.0)
Monocytes Absolute: 0.6 10*3/uL (ref 0.1–1.0)
Platelets: 156 10*3/uL (ref 150–400)
RDW: 14.2 % (ref 11.5–15.5)
WBC: 8.9 10*3/uL (ref 4.0–10.5)

## 2012-01-29 LAB — PREPARE RBC (CROSSMATCH)

## 2012-01-29 NOTE — Progress Notes (Signed)
Patient ID: Pamela Morrow, female   DOB: 26-Apr-1945, 67 y.o.   MRN: [id number] [facility] Progress Note:   2 Days Post-Op  Subjective: Mental status is clear Objective: Vital signs in last 24 hours: Temp:  [98.1 F (36.7 C)-99.2 F (37.3 C)] 98.1 F (36.7 C) (01/13 0452) Pulse Rate:  [69-77] 72  (01/13 0452) Resp:  [14-18] 16  (01/13 0452) BP: (89-112)/(51-64) 104/62 mmHg (01/13 0452) SpO2:  [97 %-98 %] 98 % (01/13 0452)  Intake/Output from previous day: 01/12 0701 - 01/13 0700 In: 4475.8 [I.V.:4475.8] Out: 2550 [Urine:2550] Intake/Output this shift:    Physical Exam: Work of breathing is normal.  Having bloody BMs-evacuating UGI bleed.    Lab Results:  Results for orders placed during the hospital encounter of 01/26/12 (from the past 48 hour(s))  CBC     Status: Abnormal   Collection Time   01/27/12  5:52 PM      Component Value Range Comment   WBC 17.6 (*) 4.0 - 10.5 K/uL    RBC 2.14 (*) 3.87 - 5.11 MIL/uL    Hemoglobin 7.1 (*) 12.0 - 15.0 g/dL    HCT 14.7 (*) 82.9 - 46.0 %    MCV 95.8  78.0 - 100.0 fL    MCH 33.2  26.0 - 34.0 pg    MCHC 34.6  30.0 - 36.0 g/dL    RDW 56.2  13.0 - 86.5 %    Platelets 213  150 - 400 K/uL   CBC     Status: Abnormal   Collection Time   01/28/12  3:37 AM      Component Value Range Comment   WBC 13.9 (*) 4.0 - 10.5 K/uL    RBC 2.33 (*) 3.87 - 5.11 MIL/uL    Hemoglobin 7.7 (*) 12.0 - 15.0 g/dL    HCT 78.4 (*) 69.6 - 46.0 %    MCV 94.4  78.0 - 100.0 fL    MCH 33.0  26.0 - 34.0 pg    MCHC 35.0  30.0 - 36.0 g/dL    RDW 29.5  28.4 - 13.2 %    Platelets 187  150 - 400 K/uL   BASIC METABOLIC PANEL     Status: Abnormal   Collection Time   01/28/12  3:37 AM      Component Value Range Comment   Sodium 135  135 - 145 mEq/L    Potassium 4.1  3.5 - 5.1 mEq/L    Chloride 105  96 - 112 mEq/L    CO2 24  19 - 32 mEq/L    Glucose, Bld 132 (*) 70 - 99 mg/dL    BUN 9  6 - 23 mg/dL    Creatinine, Ser 4.40  0.50 - 1.10 mg/dL    Calcium 7.8 (*) 8.4 - 10.5 mg/dL    GFR calc non Af Amer 90 (*) >90 mL/min    GFR calc Af Amer >90  >90 mL/min   CBC     Status: Abnormal   Collection Time   01/28/12  6:00 PM      Component Value Range Comment   WBC 11.1 (*) 4.0 - 10.5 K/uL    RBC 2.42 (*) 3.87 - 5.11 MIL/uL    Hemoglobin 7.9 (*) 12.0 - 15.0 g/dL    HCT 10.2 (*) 72.5 - 46.0 %    MCV 94.6  78.0 - 100.0 fL    MCH 32.6  26.0 - 34.0 pg    MCHC 34.5  30.0 - 36.0 g/dL    RDW 16.1  09.6 - 04.5 %    Platelets 188  150 - 400 K/uL   BASIC METABOLIC PANEL     Status: Abnormal   Collection Time   01/29/12  4:02 AM      Component Value Range Comment   Sodium 133 (*) 135 - 145 mEq/L    Potassium 4.0  3.5 - 5.1 mEq/L    Chloride 104  96 - 112 mEq/L    CO2 23  19 - 32 mEq/L    Glucose, Bld 105 (*) 70 - 99 mg/dL    BUN 10  6 - 23 mg/dL    Creatinine, Ser 4.09  0.50 - 1.10 mg/dL    Calcium 7.9 (*) 8.4 - 10.5 mg/dL    GFR calc non Af Amer 89 (*) >90 mL/min    GFR calc Af Amer >90  >90 mL/min   CBC WITH DIFFERENTIAL     Status: Abnormal   Collection Time   01/29/12  4:02 AM      Component Value Range Comment   WBC 8.9  4.0 - 10.5 K/uL    RBC 2.34 (*) 3.87 - 5.11 MIL/uL    Hemoglobin 7.6 (*) 12.0 - 15.0 g/dL    HCT 81.1 (*) 91.4 - 46.0 %    MCV 97.0  78.0 - 100.0 fL    MCH 32.5  26.0 - 34.0 pg    MCHC 33.5  30.0 - 36.0 g/dL    RDW 78.2  95.6 - 21.3 %    Platelets 156  150 - 400 K/uL    Neutrophils Relative 62  43 - 77 %    Neutro Abs 5.5  1.7 - 7.7 K/uL    Lymphocytes Relative 31  12 - 46 %    Lymphs Abs 2.7  0.7 - 4.0 K/uL    Monocytes Relative 7  3 - 12 %    Monocytes Absolute 0.6  0.1 - 1.0 K/uL    Eosinophils Relative 0  0 - 5 %    Eosinophils Absolute 0.0  0.0 - 0.7 K/uL    Basophils Relative 0  0 - 1 %    Basophils Absolute 0.0  0.0 - 0.1 K/uL     Radiology/Results: No results found.  Anti-infectives: Anti-infectives     Start     Dose/Rate Route Frequency Ordered Stop   01/27/12 1200   cefOXitin (MEFOXIN)  2 g in dextrose 5 % 50 mL IVPB  Status:  Discontinued        2 g 100 mL/hr over 30 Minutes Intravenous 4 times per day 01/27/12 1038 01/27/12 1300   01/26/12 0536   cefOXitin (MEFOXIN) 2 g in dextrose 5 % 50 mL IVPB        2 g 100 mL/hr over 30 Minutes Intravenous On call to O.R. 01/26/12 0536 01/26/12 0744          Assessment/Plan: Problem List: Patient Active Problem List  Diagnosis  . Hypothyroidism  . Encounter for screening colonoscopy  . GERD (gastroesophageal reflux disease)  . Appendiceal tumor   Begin clears PO.  Recheck CBC in am.  2 Days Post-Op    LOS: 3 days   Matt B. Daphine Deutscher, MD, [facility] [phone] beeper [phone]  01/29/2012 9:51 AM

## 2012-01-29 NOTE — Progress Notes (Signed)
Spoke to Dr. Daphine Deutscher on floor informed had 3 bright bloody BM this am MD states he expects this

## 2012-01-29 NOTE — Progress Notes (Signed)
Patient taken to bathroom had bright bloody BM patient reports two others during night. Will continue to monitor and report to MD on rounds.

## 2012-01-29 NOTE — Care Management Note (Signed)
    Page 1 of 1   01/29/2012     1:39:25 PM   CARE MANAGEMENT NOTE 01/29/2012  Patient:  Pamela Morrow, Pamela Morrow   Account Number:  [id number]  Date Initiated:  01/29/2012  Documentation initiated by:  Lorenda Ishihara  Subjective/Objective Assessment:   67 yo female admitted s/p lap colectomy with post op bleed and return to OR. PTA lived at home with spouse.     Action/Plan:   Home when stable   Anticipated DC Date:  02/02/2012   Anticipated DC Plan:  HOME/SELF CARE      DC Planning Services  CM consult      Choice offered to / List presented to:             Status of service:  Completed, signed off Medicare Important Message given?   (If response is "NO", the following Medicare IM given date fields will be blank) Date Medicare IM given:   Date Additional Medicare IM given:    Discharge Disposition:  HOME/SELF CARE  Per UR Regulation:  Reviewed for med. necessity/level of care/duration of stay  If discussed at Long Length of Stay Meetings, dates discussed:    Comments:

## 2012-01-29 NOTE — Progress Notes (Signed)
Spoke to Dr. Gerrit Friends on phone explain procedures patient has had and that she has was having bright bloody BMs during night and that I spoke to Dr. Daphine Deutscher this am after she had had 3 BM and he states this is what he expects that she will be evacuating her UGI bleed. But that I have documented patient has had 5 BM but i just spoke with husband who states patient has been having a bloody BM about every hour. The last BM emptied appeared to be darker in color than previous. Patient denies lightheadedness or dizziness and last HR at 1400 was 72 and b/p 94/56 but this appears to be how patient trends. Husband at desk and concerned. Dr. Gerrit Friends states it is fine to check H&H now, informed him her hgb this am was 7.6 and to my knowledge had one unit PRBC yesterday. MD states to call him with results if hgb less than 7.5 and to cross and screen and hold but do not transfuse 2 units PRBC.patient and husband informed

## 2012-01-30 LAB — CBC
HCT: 22 % — ABNORMAL LOW (ref 36.0–46.0)
MCHC: 34.1 g/dL (ref 30.0–36.0)
MCV: 96.1 fL (ref 78.0–100.0)
Platelets: 210 10*3/uL (ref 150–400)
RDW: 13.7 % (ref 11.5–15.5)

## 2012-01-30 NOTE — Progress Notes (Signed)
Spoke to Dr. Daphine Deutscher on floor informed patient had about bloody BM last night, and family was concerned. MD states he expects patient will continue to have bloody BM today.

## 2012-01-30 NOTE — Progress Notes (Signed)
Patient ID: Pamela Morrow, female   DOB: [birth year], 67 y.o.   MRN: [id number] [facility] Progress Note:   3 Days Post-Op  Subjective: Mental status is clear.  Continues with bloody bms Objective: Vital signs in last 24 hours: Temp:  [98.1 F (36.7 C)-98.6 F (37 C)] 98.1 F (36.7 C) (01/14 0615) Pulse Rate:  [71-75] 75  (01/14 0615) Resp:  [18] 18  (01/14 0615) BP: (94-119)/(56-68) 119/68 mmHg (01/14 0615) SpO2:  [97 %-99 %] 99 % (01/14 0615)  Intake/Output from previous day: 01/13 0701 - 01/14 0700 In: 3260 [P.O.:480; I.V.:2780] Out: 2675 [Urine:2150; Stool:525] Intake/Output this shift: Total I/O In: 240 [P.O.:240] Out: 1050 [Urine:800; Stool:250]  Physical Exam: Work of breathing is normal.  VSS,  Abdomen nontender  Lab Results:  Results for orders placed during the hospital encounter of 01/26/12 (from the past 48 hour(s))  CBC     Status: Abnormal   Collection Time   01/28/12  6:00 PM      Component Value Range Comment   WBC 11.1 (*) 4.0 - 10.5 K/uL    RBC 2.42 (*) 3.87 - 5.11 MIL/uL    Hemoglobin 7.9 (*) 12.0 - 15.0 g/dL    HCT 91.4 (*) 78.2 - 46.0 %    MCV 94.6  78.0 - 100.0 fL    MCH 32.6  26.0 - 34.0 pg    MCHC 34.5  30.0 - 36.0 g/dL    RDW 95.6  21.3 - 08.6 %    Platelets 188  150 - 400 K/uL   BASIC METABOLIC PANEL     Status: Abnormal   Collection Time   01/29/12  4:02 AM      Component Value Range Comment   Sodium 133 (*) 135 - 145 mEq/L    Potassium 4.0  3.5 - 5.1 mEq/L    Chloride 104  96 - 112 mEq/L    CO2 23  19 - 32 mEq/L    Glucose, Bld 105 (*) 70 - 99 mg/dL    BUN 10  6 - 23 mg/dL    Creatinine, Ser 5.78  0.50 - 1.10 mg/dL    Calcium 7.9 (*) 8.4 - 10.5 mg/dL    GFR calc non Af Amer 89 (*) >90 mL/min    GFR calc Af Amer >90  >90 mL/min   CBC WITH DIFFERENTIAL     Status: Abnormal   Collection Time   01/29/12  4:02 AM      Component Value Range Comment   WBC 8.9  4.0 - 10.5 K/uL    RBC 2.34 (*) 3.87 - 5.11 MIL/uL    Hemoglobin  7.6 (*) 12.0 - 15.0 g/dL    HCT 46.9 (*) 62.9 - 46.0 %    MCV 97.0  78.0 - 100.0 fL    MCH 32.5  26.0 - 34.0 pg    MCHC 33.5  30.0 - 36.0 g/dL    RDW 52.8  41.3 - 24.4 %    Platelets 156  150 - 400 K/uL    Neutrophils Relative 62  43 - 77 %    Neutro Abs 5.5  1.7 - 7.7 K/uL    Lymphocytes Relative 31  12 - 46 %    Lymphs Abs 2.7  0.7 - 4.0 K/uL    Monocytes Relative 7  3 - 12 %    Monocytes Absolute 0.6  0.1 - 1.0 K/uL    Eosinophils Relative 0  0 - 5 %  Eosinophils Absolute 0.0  0.0 - 0.7 K/uL    Basophils Relative 0  0 - 1 %    Basophils Absolute 0.0  0.0 - 0.1 K/uL   TYPE AND SCREEN     Status: Normal (Preliminary result)   Collection Time   01/29/12  7:28 PM      Component Value Range Comment   ABO/RH(D) O NEG      Antibody Screen NEG      Sample Expiration 02/01/2012      Unit Number [id number]      Blood Component Type RBC LR PHER1      Unit division 00      Status of Unit ALLOCATED      Transfusion Status OK TO TRANSFUSE      Crossmatch Result Compatible      Unit Number [id number]      Blood Component Type RED CELLS,LR      Unit division 00      Status of Unit ALLOCATED      Transfusion Status OK TO TRANSFUSE      Crossmatch Result Compatible     HEMOGLOBIN AND HEMATOCRIT, BLOOD     Status: Abnormal   Collection Time   01/29/12  7:29 PM      Component Value Range Comment   Hemoglobin 7.8 (*) 12.0 - 15.0 g/dL    HCT 96.2 (*) 95.2 - 46.0 %   PREPARE RBC (CROSSMATCH)     Status: Normal   Collection Time   01/29/12  8:30 PM      Component Value Range Comment   Order Confirmation ORDER PROCESSED BY BLOOD BANK     CBC     Status: Abnormal   Collection Time   01/30/12  4:42 AM      Component Value Range Comment   WBC 5.9  4.0 - 10.5 K/uL    RBC 2.29 (*) 3.87 - 5.11 MIL/uL    Hemoglobin 7.5 (*) 12.0 - 15.0 g/dL    HCT 84.1 (*) 32.4 - 46.0 %    MCV 96.1  78.0 - 100.0 fL    MCH 32.8  26.0 - 34.0 pg    MCHC 34.1  30.0 - 36.0 g/dL    RDW 40.1  02.7 - 25.3 %      Platelets 210  150 - 400 K/uL     Radiology/Results: No results found.  Anti-infectives: Anti-infectives     Start     Dose/Rate Route Frequency Ordered Stop   01/27/12 1200   cefOXitin (MEFOXIN) 2 g in dextrose 5 % 50 mL IVPB  Status:  Discontinued        2 g 100 mL/hr over 30 Minutes Intravenous 4 times per day 01/27/12 1038 01/27/12 1300   01/26/12 0536   cefOXitin (MEFOXIN) 2 g in dextrose 5 % 50 mL IVPB        2 g 100 mL/hr over 30 Minutes Intravenous On call to O.R. 01/26/12 0536 01/26/12 0744          Assessment/Plan: Problem List: Patient Active Problem List  Diagnosis  . Hypothyroidism  . Encounter for screening colonoscopy  . GERD (gastroesophageal reflux disease)  . Appendiceal tumor    Will advance to full for dinner tonight.  CBC stable.  Recheck in am.  3 Days Post-Op    LOS: 4 days   Matt B. Daphine Deutscher, MD, [facility] [phone] beeper [phone]  01/30/2012 10:50 AM

## 2012-01-31 DIAGNOSIS — K922 Gastrointestinal hemorrhage, unspecified: Secondary | ICD-10-CM | POA: Diagnosis not present

## 2012-01-31 LAB — CBC
Hemoglobin: 7.7 g/dL — ABNORMAL LOW (ref 12.0–15.0)
MCHC: 34.5 g/dL (ref 30.0–36.0)
RBC: 2.34 MIL/uL — ABNORMAL LOW (ref 3.87–5.11)
WBC: 5.5 10*3/uL (ref 4.0–10.5)

## 2012-01-31 MED ORDER — PANTOPRAZOLE SODIUM 20 MG PO TBEC: 20.0000 mg | DELAYED_RELEASE_TABLET | Freq: Every day | ORAL | Status: DC

## 2012-01-31 MED ORDER — HYDROCODONE-ACETAMINOPHEN 5-500 MG PO TABS: 1.0000 | ORAL_TABLET | Freq: Every day | ORAL | Status: DC

## 2012-01-31 NOTE — Discharge Summary (Signed)
Physician Discharge Summary  Patient ID: Pamela Morrow MRN: [id number] DOB/AGE: April 10, 1945 67 y.o.  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  Dropped Hg postop and taken back to OR to rule out intraabdominal hemorrhage.  Pt had no bloody BM or hemetemesis but HG had dropped to 7 range.  Evidence of UGI bleed on laparoscopy with bowel loops filled with blood.  Endoscopy confirmed a Mallory Weiss tear that was made worse by the heparin that she received for prophylaxis and a later revealed history by her husband that she takes many supplements and has had chelation therapy--that may have contributed to this bleeding diathesis.  She responded to 1 unit transfusion and protonix drip.  Ready for discharge on liquid diet  Consults: none  Significant Diagnostic Studies: endoscopy    Discharge Exam: Blood pressure 94/59, pulse 77, temperature 98.5 F (36.9 C), temperature source Oral, resp. rate 18, height 5\' 6"  (1.676 m), weight 160 lb (72.576 kg), SpO2 99.00%. Incisions bland;  No abdominal pain.    Disposition: 01-Home or Self Care  Discharge Orders    Future Orders Please Complete By Expires   Diet - low sodium heart healthy      Increase activity slowly      Discharge instructions      Comments:   Take Protonix and check with Dr Karilyn Cota       Medication List     As of 01/31/2012  1:26 PM    STOP taking these medications         MAGNESIUM CITRATE PO      NALTREXONE HCL PO      NON FORMULARY      NONFORMULARY OR COMPOUNDED ITEM      TAKE these medications         acetaminophen 325 MG tablet   Commonly known as: TYLENOL   Take 650 mg by mouth as  needed. For pain      CO-Q 10 OMEGA-3 FISH OIL PO   Take 1 capsule by mouth daily.      HYDROcodone-acetaminophen 5-500 MG per tablet   Commonly known as: VICODIN   Take 1 tablet by mouth at bedtime.      nystatin-triamcinolone cream   Commonly known as: MYCOLOG II   Apply 1 application topically as needed. ONLY FOR FUNGUS FLARE UP      pantoprazole 20 MG tablet   Commonly known as: PROTONIX   Take 1 tablet (20 mg total) by mouth daily.      PRESCRIPTION MEDICATION   Apply 1 application topically daily. Compounded natural estrogen cream      thyroid 60 MG tablet   Commonly known as: ARMOUR   Take 60 mg by mouth daily before breakfast.      VITAMIN B-12 SL   Place 1,800 mcg under the tongue daily.      Vitamin D 2000 UNITS tablet   Take 4,000 Units by mouth daily.           Follow-up Information    Follow up with Natividad Schlosser B, MD. In 3 weeks.   Contact information:   [address] [place] [address] [phone]  SignedValarie Merino 01/31/2012, 1:26 PM

## 2012-01-31 NOTE — Progress Notes (Signed)
Pt for d/c home today. IV d/c'd. Dressing removed by MD. Staple removal as ordered & SS application thereafter. Pt is tolerating Full liquids at this time. Ambulates well. Denies pain/discomfort at this time. Husband at bedside to assist with d/c.

## 2012-02-01 LAB — TYPE AND SCREEN: Unit division: 0

## 2012-02-06 NOTE — Addendum Note (Signed)
Addendum  created 02/06/12 0915 by Aiyonna Lucado E Jaryd Drew, CRNA   Modules edited:Anesthesia Events, Anesthesia Medication Administration    

## 2012-02-06 NOTE — Addendum Note (Signed)
Addendum  created 02/06/12 0915 by Illene Silver, CRNA   Modules edited:Anesthesia Events, Anesthesia Medication Administration

## 2012-02-16 ENCOUNTER — Encounter (INDEPENDENT_AMBULATORY_CARE_PROVIDER_SITE_OTHER): Payer: Self-pay | Admitting: Surgery

## 2012-02-16 ENCOUNTER — Ambulatory Visit (INDEPENDENT_AMBULATORY_CARE_PROVIDER_SITE_OTHER): Payer: Medicare Other | Admitting: Surgery

## 2012-02-16 VITALS — BP 114/62 | HR 84 | Temp 97.2°F | Resp 12 | Ht 66.0 in | Wt 144.0 lb

## 2012-02-16 DIAGNOSIS — D373 Neoplasm of uncertain behavior of appendix: Secondary | ICD-10-CM

## 2012-02-16 DIAGNOSIS — K922 Gastrointestinal hemorrhage, unspecified: Secondary | ICD-10-CM

## 2012-02-16 DIAGNOSIS — D371 Neoplasm of uncertain behavior of stomach: Secondary | ICD-10-CM

## 2012-02-16 NOTE — Progress Notes (Signed)
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  Postop visit after right colectomy for the above mentioned mucinous neoplasm of the appendix. Her stay was complicated by an upper GI bleed probably from tears at the EG junction. She has a history of H. Pylori infection for which she was treated in the past. I'm going to defer to Dr. Karilyn Cota about the need for subsequent upper endoscopy. Her incision seemed to heal nicely. She is back on some of her anti autoimmune thereapies as recommended by her FNP.   I will be glad to see her again on an as needed basis.

## 2012-02-16 NOTE — Patient Instructions (Signed)
Followup with Dr. Karilyn Cota regarding need for followup endoscopy

## 2012-02-25 NOTE — Progress Notes (Signed)
Dr. Luretha Murphy kindly  called me and brought me up-to-date on her recent hospitalization. Patient has office visit in near future

## 2012-03-19 ENCOUNTER — Ambulatory Visit (INDEPENDENT_AMBULATORY_CARE_PROVIDER_SITE_OTHER): Payer: Medicare Other | Admitting: Internal Medicine

## 2012-03-19 ENCOUNTER — Encounter (INDEPENDENT_AMBULATORY_CARE_PROVIDER_SITE_OTHER): Payer: Self-pay | Admitting: Internal Medicine

## 2012-03-19 VITALS — BP 120/70 | HR 72 | Temp 96.9°F | Resp 18 | Ht 66.0 in | Wt 145.5 lb

## 2012-03-19 DIAGNOSIS — K219 Gastro-esophageal reflux disease without esophagitis: Secondary | ICD-10-CM

## 2012-03-19 DIAGNOSIS — D649 Anemia, unspecified: Secondary | ICD-10-CM | POA: Insufficient documentation

## 2012-03-19 NOTE — Progress Notes (Signed)
Presenting complaint;  Followup regarding upper GI bleed secondary to Mallory-Weiss tear post operatively.  Subjective:  Patient is 67 year old Caucasian female who has history of GERD and has been doing well on pantoprazole. She underwent lap-assisted right hemicolectomy on 01/25/2010 for mass found at base of appendix on recent screening colonoscopy. 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 On postop day 1 her hemoglobin dropped by 4 g and she underwent repeat laparoscopy. There was no evidence of intraperitoneal bleed but there was blood and small bowel. She underwent esophagogastroduodenoscopy and found to have coffee-ground material in her stomach and Mallory-Weiss tear felt to be source of blood loss. She apparently developed nausea vomiting after receiving an antibiotic reason for Mallory-Weiss tear. Her hemoglobin dropped to 7 g and she received one unit of PRBCs. Today she has no complaints. Her weakness has gradually resolved. She is able to go 1 flight of stairs and does not feel short of breath. She denies heartburn nausea vomiting melena or rectal bleeding. She has occasional epigastric pain but this does not seem to be associated with meals. She does not take NSAIDs.  Current Medications: Current Outpatient Prescriptions  Medication Sig Dispense Refill  . Cholecalciferol (VITAMIN D) 2000 UNITS tablet Take 4,000 Units by mouth daily.      . Coenzyme Q10-Fish Oil-Vit E (CO-Q 10 OMEGA-3 FISH OIL PO) Take 1 capsule by mouth daily.      . Cyanocobalamin (VITAMIN B-12 SL) Place 1,800 mcg under the tongue daily.      . Magnesium 200 MG TABS Take by mouth daily.      Marland Kitchen nystatin-triamcinolone (MYCOLOG II) cream Apply 1 application topically as needed. ONLY FOR FUNGUS FLARE UP      . pantoprazole (PROTONIX) 40 MG tablet Take 40 mg by mouth.      Marland Kitchen PRESCRIPTION MEDICATION Apply 1 application topically daily. Compounded natural estrogen cream      . progesterone (PROMETRIUM) 200  MG capsule Take 200 mg by mouth daily.      Marland Kitchen thyroid (ARMOUR) 60 MG tablet Take 60 mg by mouth daily before breakfast.       No current facility-administered medications for this visit.     Objective: Blood pressure 120/70, pulse 72, temperature 96.9 F (36.1 C), temperature source Oral, resp. rate 18, height 5\' 6"  (1.676 m), weight 145 lb 8 oz (65.998 kg). Patient is alert and in no acute distress. Conjunctiva is pink. Sclera is nonicteric Oropharyngeal mucosa is normal. No neck masses or thyromegaly noted. Cardiac exam with regular rhythm normal S1 and S2. No murmur or gallop noted. Lungs are clear to auscultation. Abdomen is symmetrical. Bowel sounds are normal. She has short lower midline scar. Abdomen is soft and nontender without organomegaly or masses.  No LE edema or clubbing noted.    Assessment:  #1. Perioperative upper GI bleed secondary to Mallory-Weiss tear due to nausea and vomiting secondary to antibiotic. She did receive one unit of PRBC and predischarge hemoglobin was 7.9 g. She does not have any symptoms of anemia and does not appear to be pale. Therefore I suspect her H&H should be close to normal. Occasional epigastric pain which is possibly musculoskeletal. I do not believe there is need or indication to reexamine her upper GI tract. Will consider EGD only if there is evidence of GI bleed or hemoglobin remains low. #2. History of erosive reflux esophagitis. Symptoms are well controlled with therapy.   Plan:  Patient will go to the lab  for CBC. Continue pantoprazole at 40 mg by mouth every morning for now. Further recommendations to follow.

## 2012-03-19 NOTE — Patient Instructions (Signed)
Continue pantoprazole at current dose which is 40 mg daily. Physician will contact you with results of CBC.

## 2012-03-20 LAB — CBC
HCT: 29.7 % — ABNORMAL LOW (ref 36.0–46.0)
Hemoglobin: 9.7 g/dL — ABNORMAL LOW (ref 12.0–15.0)
MCV: 86.1 fL (ref 78.0–100.0)
Platelets: 303 10*3/uL (ref 150–400)
RBC: 3.45 MIL/uL — ABNORMAL LOW (ref 3.87–5.11)
WBC: 4.6 10*3/uL (ref 4.0–10.5)

## 2012-03-21 ENCOUNTER — Telehealth (INDEPENDENT_AMBULATORY_CARE_PROVIDER_SITE_OTHER): Payer: Self-pay | Admitting: *Deleted

## 2012-03-21 DIAGNOSIS — D649 Anemia, unspecified: Secondary | ICD-10-CM

## 2012-03-21 NOTE — Telephone Encounter (Signed)
Per Dr.Rehman the patient will need to have repeat in 8 weeks

## 2012-04-24 ENCOUNTER — Telehealth (INDEPENDENT_AMBULATORY_CARE_PROVIDER_SITE_OTHER): Payer: Self-pay | Admitting: *Deleted

## 2012-05-02 ENCOUNTER — Other Ambulatory Visit (INDEPENDENT_AMBULATORY_CARE_PROVIDER_SITE_OTHER): Payer: Self-pay | Admitting: *Deleted

## 2012-05-02 ENCOUNTER — Encounter (INDEPENDENT_AMBULATORY_CARE_PROVIDER_SITE_OTHER): Payer: Self-pay | Admitting: *Deleted

## 2012-05-02 DIAGNOSIS — D649 Anemia, unspecified: Secondary | ICD-10-CM

## 2012-05-27 LAB — CBC
HCT: 37 % (ref 36.0–46.0)
Hemoglobin: 12.4 g/dL (ref 12.0–15.0)
MCHC: 33.5 g/dL (ref 30.0–36.0)
MCV: 84.5 fL (ref 78.0–100.0)

## 2012-06-07 NOTE — Telephone Encounter (Signed)
This has been addressed.

## 2012-06-11 ENCOUNTER — Telehealth (INDEPENDENT_AMBULATORY_CARE_PROVIDER_SITE_OTHER): Payer: Self-pay | Admitting: *Deleted

## 2012-06-11 NOTE — Telephone Encounter (Signed)
Pamela Morrow said she had lab work done about 1 1/2 weeks ago and has been out of town. She would like to get the lab results and a copy also. Her return phone number is [phone].

## 2012-06-11 NOTE — Telephone Encounter (Signed)
Shared with Dr.Rehman and he states that he called her last week and no answer , he is going to call her back with the results .

## 2012-06-12 ENCOUNTER — Telehealth (INDEPENDENT_AMBULATORY_CARE_PROVIDER_SITE_OTHER): Payer: Self-pay | Admitting: *Deleted

## 2012-06-12 DIAGNOSIS — D649 Anemia, unspecified: Secondary | ICD-10-CM

## 2012-06-12 NOTE — Telephone Encounter (Signed)
Lab report mailed to patient

## 2012-06-12 NOTE — Telephone Encounter (Signed)
Per Dr.Rehman the patient will need to have labs drawn in 6 months. 

## 2012-11-07 ENCOUNTER — Other Ambulatory Visit (INDEPENDENT_AMBULATORY_CARE_PROVIDER_SITE_OTHER): Payer: Self-pay | Admitting: *Deleted

## 2012-11-07 ENCOUNTER — Encounter (INDEPENDENT_AMBULATORY_CARE_PROVIDER_SITE_OTHER): Payer: Self-pay | Admitting: *Deleted

## 2012-11-07 DIAGNOSIS — D649 Anemia, unspecified: Secondary | ICD-10-CM

## 2013-06-23 ENCOUNTER — Encounter (INDEPENDENT_AMBULATORY_CARE_PROVIDER_SITE_OTHER): Payer: Self-pay

## 2013-09-29 ENCOUNTER — Ambulatory Visit (INDEPENDENT_AMBULATORY_CARE_PROVIDER_SITE_OTHER): Payer: Medicare Other | Admitting: Internal Medicine

## 2013-09-29 ENCOUNTER — Encounter (INDEPENDENT_AMBULATORY_CARE_PROVIDER_SITE_OTHER): Payer: Self-pay | Admitting: Internal Medicine

## 2013-09-29 VITALS — BP 108/70 | HR 72 | Temp 97.9°F | Ht 65.5 in | Wt 152.6 lb

## 2013-09-29 DIAGNOSIS — K2100 Gastro-esophageal reflux disease with esophagitis, without bleeding: Secondary | ICD-10-CM

## 2013-09-29 MED ORDER — PANTOPRAZOLE SODIUM 40 MG PO TBEC: 40.0000 mg | DELAYED_RELEASE_TABLET | Freq: Every day | ORAL | Status: DC

## 2013-09-29 NOTE — Patient Instructions (Signed)
OV in one year . Continue the protonix.

## 2013-09-29 NOTE — Progress Notes (Signed)
Subjective:    Patient ID: Pamela Morrow, female    DOB: 1946-01-01, 68 y.o.   MRN: [id number]  HPI Here today for f/u.  She tells me she some epigastric tenderness. SHe tells me she had a burning sensation across her epigastric region. She felt queasy. She had not been taking her Protonix.. She tells me she saw a massage therapist and had some kind of maneuver on her hiatal hernia. She tells me she feels good. Her appetite is good. No weight loss. She usually has a BM daily. No melena or BRRB.  Hx of h pylori.    10/04/2011 EGD;Impression:  Erosive reflux esophagitis and small sliding hiatal hernia.  Mucosal changes involving gastric body suspicious for portal gastropathy. Biopsy taken.  No evidence of esophageal or gastric varices.  3 mm polyp ablated via cold biopsy from cecum.  12 mm submucosal lesion at cecum possibly leiomyoma.  Small external hemorrhoids.            She underwent lap-assisted right hemicolectomy on 01/25/2010 for mass found at base of appendix on recent screening colonoscopy. 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 On postop day 1 her hemoglobin dropped by 4 g and she underwent repeat laparoscopy. There was no evidence of intraperitoneal bleed but there was blood and small bowel. She underwent esophagogastroduodenoscopy and found to have coffee-ground material in her stomach and Mallory-Weiss tear felt to be source of blood loss. She apparently developed nausea vomiting after receiving an antibiotic reason for Mallory-Weiss tear          Review of Systems   Past Medical History  Diagnosis Date  . Hypothyroid   . Arthritis   . GERD (gastroesophageal reflux disease)   . Osteopenia   . H/O hiatal hernia   . History of hepatitis     AS TEENAGER - NO RESIDUAL EFFECTS  . Mass of appendix     AND CECAL MASS  . Autoimmune disease     OF CONNECTIVE TISSUE    Past Surgical History  Procedure Laterality Date  . Colonoscopy    .  Laparoscopic partial colectomy  01/26/2012    Procedure: LAPAROSCOPIC PARTIAL COLECTOMY;  Surgeon: Pedro Earls, MD;  Location: [facility];  Service: General;  Laterality: N/A;  Lap Assisted Partial Colectomy  . Laparoscopy  01/27/2012    Procedure: LAPAROSCOPY DIAGNOSTIC;  Surgeon: Pedro Earls, MD;  Location: [facility];  Service: General;  Laterality: N/A;  . Upper gi endoscopy  01/27/2012    Procedure: UPPER GI ENDOSCOPY;  Surgeon: Pedro Earls, MD;  Location: [facility];  Service: General;  Laterality: N/A;    Allergies  Allergen Reactions  . Codeine     Nausea    Current Outpatient Prescriptions on File Prior to Visit  Medication Sig Dispense Refill  . Cholecalciferol (VITAMIN D) 2000 UNITS tablet Take 4,000 Units by mouth daily.      . Coenzyme Q10-Fish Oil-Vit E (CO-Q 10 OMEGA-3 FISH OIL PO) Take 1 capsule by mouth daily.      . Cyanocobalamin (VITAMIN B-12 SL) Place 1,000 mcg under the tongue daily.       . Magnesium 200 MG TABS Take by mouth daily.      . progesterone (PROMETRIUM) 200 MG capsule Take 200 mg by mouth daily.      Marland Kitchen thyroid (ARMOUR) 60 MG tablet Take 60 mg by mouth daily before breakfast.       No current facility-administered medications  on file prior to visit.         Objective:   Physical Exam  Filed Vitals:   09/29/13 1101  BP: 108/70  Pulse: 72  Temp: 97.9 F (36.6 C)  Height: 5' 5.5" (1.664 m)  Weight: 152 lb 9.6 oz (69.219 kg)   Alert and oriented. Skin warm and dry. Oral mucosa is moist.   . Sclera anicteric, conjunctivae is pink. Thyroid not enlarged. No cervical lymphadenopathy. Lungs clear. Heart regular rate and rhythm.  Abdomen is soft. Bowel sounds are positive. No hepatomegaly. No abdominal masses felt. No tenderness.  No edema to lower extremities. Patient is alert and oriented.       Assessment & Plan:  GERD.  Rx for Protonix 40mg  daily.  OV 1 yr.

## 2014-06-30 ENCOUNTER — Encounter (INDEPENDENT_AMBULATORY_CARE_PROVIDER_SITE_OTHER): Payer: Self-pay | Admitting: *Deleted

## 2014-10-01 ENCOUNTER — Ambulatory Visit (INDEPENDENT_AMBULATORY_CARE_PROVIDER_SITE_OTHER): Payer: Medicare HMO | Admitting: Internal Medicine

## 2014-10-01 ENCOUNTER — Encounter (INDEPENDENT_AMBULATORY_CARE_PROVIDER_SITE_OTHER): Payer: Self-pay | Admitting: Internal Medicine

## 2014-10-01 VITALS — BP 100/72 | HR 72 | Temp 98.0°F | Ht 65.5 in | Wt 148.1 lb

## 2014-10-01 DIAGNOSIS — K6389 Other specified diseases of intestine: Secondary | ICD-10-CM | POA: Diagnosis not present

## 2014-10-01 DIAGNOSIS — K219 Gastro-esophageal reflux disease without esophagitis: Secondary | ICD-10-CM | POA: Diagnosis not present

## 2014-10-01 MED ORDER — PANTOPRAZOLE SODIUM 20 MG PO TBEC: 20.0000 mg | DELAYED_RELEASE_TABLET | Freq: Every day | ORAL | Status: DC

## 2014-10-01 NOTE — Progress Notes (Signed)
Subjective:    Patient ID: Pamela Morrow, female    DOB: [birth year], 69 y.o.   MRN: [id number]  HPI Here today for f/u. She was last seen in September of 2015. Her last EGD was in 2013 which revealed reflux esophagitis and small hiatal hernia.  She tells me she is doing good. She says she sometimes has epigastric discomfort a couple of hrs. She has not been taking the Protnix due to her concern for bone loss. Appetite is good. No weight loss. Usually has a BM daily and sometimes several a day.    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 No evidence of high grade dysplasia or malignancy.  Sixteen lymph nodes negative for neoplasm. Resection margins negative for atypia or malignancy.   10/04/2011 EGD/Colonoscopy;Impression:  Erosive reflux esophagitis and small sliding hiatal hernia.  Mucosal changes involving gastric body suspicious for portal gastropathy. Biopsy taken.  No evidence of esophageal or gastric varices.  3 mm polyp ablated via cold biopsy from cecum.  12 mm submucosal lesion at cecum possibly leiomyoma.  Small external hemorrhoids. H. Pylori positive.          Review of Systems Past Medical History  Diagnosis Date  . Hypothyroid   . Arthritis   . GERD (gastroesophageal reflux disease)   . Osteopenia   . H/O hiatal hernia   . History of hepatitis     AS TEENAGER - NO RESIDUAL EFFECTS  . Mass of appendix     AND CECAL MASS  . Autoimmune disease     OF CONNECTIVE TISSUE    Past Surgical History  Procedure Laterality Date  . Colonoscopy    . Laparoscopic partial colectomy  01/26/2012    Procedure: LAPAROSCOPIC PARTIAL COLECTOMY;  Surgeon: Pedro Earls, MD;  Location: [facility];  Service: General;  Laterality: N/A;  Lap Assisted Partial Colectomy  . Laparoscopy  01/27/2012    Procedure: LAPAROSCOPY DIAGNOSTIC;  Surgeon: Pedro Earls, MD;  Location: [facility];  Service: General;  Laterality: N/A;  . Upper gi endoscopy  01/27/2012    Procedure: UPPER GI ENDOSCOPY;  Surgeon: Pedro Earls, MD;  Location: [facility];  Service: General;  Laterality: N/A;    Allergies  Allergen Reactions  . Codeine     Nausea    Current Outpatient Prescriptions on File Prior to Visit  Medication Sig Dispense Refill  . Cholecalciferol (VITAMIN D) 2000 UNITS tablet Take 4,000 Units by mouth daily.    . Coenzyme Q10-Fish Oil-Vit E (CO-Q 10 OMEGA-3 FISH OIL PO) Take 1 capsule by mouth daily.    . Cyanocobalamin (VITAMIN B-12 SL) Place 1,000 mcg under the tongue daily.     . folic acid (FOLVITE) 655 MCG tablet Take 400 mcg by mouth daily.    . Iodine, Kelp, 0.15 MG TABS Take by mouth. 12.5mg     . Magnesium 200 MG TABS Take by mouth daily.    . naltrexone (DEPADE) 50 MG tablet Take 4.5 mg by mouth daily.    . pantoprazole (PROTONIX) 40 MG tablet Take 1 tablet (40 mg total) by mouth daily. (Patient not taking: Reported on 10/01/2014) 90 tablet 3  . pantoprazole (PROTONIX) 40 MG tablet Take 1 tablet (40 mg total) by mouth daily. 30 tablet 11  . Probiotic Product (PROBIOTIC DAILY PO) Take by mouth.    . progesterone (PROMETRIUM) 200 MG capsule Take 200 mg by mouth daily.    Marland Kitchen thyroid (  ARMOUR) 60 MG tablet Take 60 mg by mouth daily before breakfast.     No current facility-administered medications on file prior to visit.        Objective:   Physical Exam Blood pressure 100/72, pulse 72, temperature 98 F (36.7 C), height 5' 5.5" (1.664 m), weight 148 lb 1.6 oz (67.178 kg). Alert and oriented. Skin warm and dry. Oral mucosa is moist.   . Sclera anicteric, conjunctivae is pink. Thyroid not enlarged. No cervical lymphadenopathy. Lungs clear. Heart regular rate and rhythm.  Abdomen is soft. Bowel sounds are positive. No hepatomegaly. No abdominal masses felt. No tenderness.  No edema to lower extremities.         Assessment & Plan:  Hx of colonic mass with rt  hemicolectomy. No change in her stools.  GERD: She has been off her Protonix due to concern for bone loss. Needs to restart Protonix 20mg  daily. OV in 1 year.

## 2014-10-01 NOTE — Patient Instructions (Signed)
OV in 1 year. Continue the Protonix 

## 2014-10-02 LAB — H. PYLORI BREATH TEST: H. PYLORI BREATH TEST: NOT DETECTED

## 2015-07-19 ENCOUNTER — Encounter (INDEPENDENT_AMBULATORY_CARE_PROVIDER_SITE_OTHER): Payer: Self-pay | Admitting: Internal Medicine

## 2015-10-12 ENCOUNTER — Ambulatory Visit (INDEPENDENT_AMBULATORY_CARE_PROVIDER_SITE_OTHER): Payer: Medicare HMO | Admitting: Internal Medicine

## 2015-10-12 ENCOUNTER — Encounter (INDEPENDENT_AMBULATORY_CARE_PROVIDER_SITE_OTHER): Payer: Self-pay | Admitting: Internal Medicine

## 2015-10-12 VITALS — BP 92/52 | HR 64 | Temp 97.6°F | Ht 65.5 in | Wt 147.4 lb

## 2015-10-12 DIAGNOSIS — D373 Neoplasm of uncertain behavior of appendix: Secondary | ICD-10-CM

## 2015-10-12 DIAGNOSIS — D490 Neoplasm of unspecified behavior of digestive system: Secondary | ICD-10-CM

## 2015-10-12 DIAGNOSIS — K219 Gastro-esophageal reflux disease without esophagitis: Secondary | ICD-10-CM

## 2015-10-12 LAB — CREATININE, SERUM: Creat: 0.86 mg/dL (ref 0.50–0.99)

## 2015-10-12 MED ORDER — PANTOPRAZOLE SODIUM 20 MG PO TBEC: 20.0000 mg | DELAYED_RELEASE_TABLET | Freq: Two times a day (BID) | ORAL | 5 refills | Status: DC

## 2015-10-12 NOTE — Progress Notes (Addendum)
Subjective:    Patient ID: Pamela Morrow, female    DOB: [birth year], 69 y.o.   MRN: [id number]  HPI Here today for f/u. She was last seen in September of 2016.  She tells me she is doing good. She occasionally has acid reflux and takes Protonix about every other day. She has a BM daily. No melena or BRRB. Her appetite is good. No weight loss. She does eat more often to prevent stomach discomfort.   . No family hx of CRC  .  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 No evidence of high grade dysplasia or malignancy.  Sixteen lymph nodes negative for neoplasm. Resection margins negative for atypia or malignancy.   10/04/2011 EGD/Colonoscopy;Impression:  Erosive reflux esophagitis and small sliding hiatal hernia.  Mucosal changes involving gastric body suspicious for portal gastropathy. Biopsy taken.  No evidence of esophageal or gastric varices.  3 mm polyp ablated via cold biopsy from cecum.  12 mm submucosal lesion at cecum possibly leiomyoma.  Small external hemorrhoids. H. Pylori positive.    Review of Systems Past Medical History:  Diagnosis Date  . Arthritis   . Autoimmune disease ([place])    OF CONNECTIVE TISSUE  . GERD (gastroesophageal reflux disease)   . H/O hiatal hernia   . History of hepatitis    AS TEENAGER - NO RESIDUAL EFFECTS  . Hypothyroid   . Mass of appendix    AND CECAL MASS  . Osteopenia     Past Surgical History:  Procedure Laterality Date  . COLONOSCOPY    . LAPAROSCOPIC PARTIAL COLECTOMY  01/26/2012   Procedure: LAPAROSCOPIC PARTIAL COLECTOMY;  Surgeon: Pedro Earls, MD;  Location: [facility];  Service: General;  Laterality: N/A;  Lap Assisted Partial Colectomy  . LAPAROSCOPY  01/27/2012   Procedure: LAPAROSCOPY DIAGNOSTIC;  Surgeon: Pedro Earls, MD;  Location: [facility];  Service: General;  Laterality: N/A;  . UPPER GI ENDOSCOPY  01/27/2012   Procedure: UPPER GI ENDOSCOPY;  Surgeon: Pedro Earls, MD;  Location: [facility];  Service: General;  Laterality: N/A;    Allergies  Allergen Reactions  . Codeine     Nausea    Current Outpatient Prescriptions on File Prior to Visit  Medication Sig Dispense Refill  . Cholecalciferol (VITAMIN D) 2000 UNITS tablet Take 4,000 Units by mouth daily.    . Coenzyme Q10-Fish Oil-Vit E (CO-Q 10 OMEGA-3 FISH OIL PO) Take 1 capsule by mouth daily.    . Cyanocobalamin (VITAMIN B-12 SL) Place 1,000 mcg under the tongue daily.     . folic acid (FOLVITE) A999333 MCG tablet Take 400 mcg by mouth daily.    . Iodine, Kelp, 0.15 MG TABS Take by mouth. 12.5mg     . Magnesium 200 MG TABS Take by mouth daily.    . naltrexone (DEPADE) 50 MG tablet Take 4.5 mg by mouth daily.    . pantoprazole (PROTONIX) 20 MG tablet Take 1 tablet (20 mg total) by mouth daily. (Patient taking differently: Take 20 mg by mouth. Every other day) 30 tablet 5  . pantoprazole (PROTONIX) 40 MG tablet Take 1 tablet (40 mg total) by mouth daily. 90 tablet 3  . Probiotic Product (PROBIOTIC DAILY PO) Take by mouth.    . progesterone (PROMETRIUM) 200 MG capsule Take 200 mg by mouth daily.    Marland Kitchen thyroid (ARMOUR) 60 MG tablet Take 60 mg by mouth daily before breakfast.     No  current facility-administered medications on file prior to visit.        Objective:   Physical Exam Blood pressure (!) 92/52, pulse 64, temperature 97.6 F (36.4 C), height 5' 5.5" (1.664 m), weight 147 lb 6.4 oz (66.9 kg). Alert and oriented. Skin warm and dry. Oral mucosa is moist.   . Sclera anicteric, conjunctivae is pink. Thyroid not enlarged. No cervical lymphadenopathy. Lungs clear. Heart regular rate and rhythm.  Abdomen is soft. Bowel sounds are positive. No hepatomegaly. No abdominal masses felt. No tenderness.  No edema to lower extremities.         Assessment & Plan:  GERD.  Continue the Protonix 20mg  but increase to  BID. I discussed with Dr. Laural Golden.    CT abdomen/pelvis with CM.

## 2015-10-12 NOTE — Patient Instructions (Addendum)
CT abdomen/pelvis with Cm. Increase Protonix 20mg  to twice a day.

## 2015-11-08 ENCOUNTER — Ambulatory Visit (HOSPITAL_COMMUNITY)
Admission: RE | Admit: 2015-11-08 | Discharge: 2015-11-08 | Disposition: A | Discharge status: Home / Self Care | Payer: Medicare HMO | Source: Ambulatory Visit | Attending: Internal Medicine | Admitting: Internal Medicine

## 2015-11-08 DIAGNOSIS — D373 Neoplasm of uncertain behavior of appendix: Secondary | ICD-10-CM | POA: Diagnosis not present

## 2015-11-08 MED ORDER — IOPAMIDOL (ISOVUE-300) INJECTION 61%
100.0000 mL | Freq: Once | INTRAVENOUS | Status: AC | PRN
  Administered 2015-11-08: 100 mL via INTRAVENOUS

## 2016-12-18 ENCOUNTER — Other Ambulatory Visit (INDEPENDENT_AMBULATORY_CARE_PROVIDER_SITE_OTHER): Payer: Self-pay | Admitting: Internal Medicine

## 2016-12-18 DIAGNOSIS — K219 Gastro-esophageal reflux disease without esophagitis: Secondary | ICD-10-CM

## 2017-12-26 ENCOUNTER — Other Ambulatory Visit (INDEPENDENT_AMBULATORY_CARE_PROVIDER_SITE_OTHER): Payer: Self-pay | Admitting: Internal Medicine

## 2017-12-26 DIAGNOSIS — K219 Gastro-esophageal reflux disease without esophagitis: Secondary | ICD-10-CM

## 2018-07-22 ENCOUNTER — Other Ambulatory Visit (INDEPENDENT_AMBULATORY_CARE_PROVIDER_SITE_OTHER): Payer: Self-pay | Admitting: Internal Medicine

## 2018-07-22 ENCOUNTER — Other Ambulatory Visit: Payer: Self-pay

## 2018-07-22 ENCOUNTER — Encounter (INDEPENDENT_AMBULATORY_CARE_PROVIDER_SITE_OTHER): Payer: Self-pay | Admitting: Internal Medicine

## 2018-07-22 ENCOUNTER — Ambulatory Visit (INDEPENDENT_AMBULATORY_CARE_PROVIDER_SITE_OTHER): Payer: Medicare Other | Admitting: Internal Medicine

## 2018-07-22 ENCOUNTER — Encounter (INDEPENDENT_AMBULATORY_CARE_PROVIDER_SITE_OTHER): Payer: Self-pay | Admitting: *Deleted

## 2018-07-22 VITALS — BP 132/74 | HR 82 | Temp 98.1°F | Ht 65.0 in | Wt 148.7 lb

## 2018-07-22 DIAGNOSIS — R195 Other fecal abnormalities: Secondary | ICD-10-CM | POA: Diagnosis not present

## 2018-07-22 NOTE — Patient Instructions (Signed)
The risks of bleeding, perforation and infection were reviewed with patient.  

## 2018-07-22 NOTE — Progress Notes (Signed)
Subjective:    Patient ID: Pamela Morrow, female    DOB: Oct 28, 1945, 73 y.o.   MRN: [id number]  HPI Presents today with co change in her bowel habits.  She tells me she having lazy bowel movement. She says she having a little trouble having a BM. In the past 3 weeks she has noticed she is not going has often. Her stool are thinner. If she hasn't eaten high fiber, will pass a smaller stool. She says the volume of her stool is not what is should be nor the size.  There has been no weight loss. Her appetite is okay.  She denies any melena or BRRB.      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 No evidence of high grade dysplasia or malignancy.  Sixteen lymph nodes negative for neoplasm. Resection margins negative for atypia or malignancy.   10/04/2011 EGD/Colonoscopy;Impression:  Erosive reflux esophagitis and small sliding hiatal hernia.  Mucosal changes involving gastric body suspicious for portal gastropathy. Biopsy taken.  No evidence of esophageal or gastric varices.  3 mm polyp ablated via cold biopsy from cecum.  12 mm submucosal lesion at cecum possibly leiomyoma.  Small external hemorrhoids. H. Pylori positive.  Biopsy: tubular adenoma.    Review of Systems Past Medical History:  Diagnosis Date  . Arthritis   . Autoimmune disease ([place])    OF CONNECTIVE TISSUE  . GERD (gastroesophageal reflux disease)   . H/O hiatal hernia   . History of hepatitis    AS TEENAGER - NO RESIDUAL EFFECTS  . Hypothyroid   . Mass of appendix    AND CECAL MASS  . Osteopenia     Past Surgical History:  Procedure Laterality Date  . COLONOSCOPY    . LAPAROSCOPIC PARTIAL COLECTOMY  01/26/2012   Procedure: LAPAROSCOPIC PARTIAL COLECTOMY;  Surgeon: Pedro Earls, MD;  Location: [facility];  Service: General;  Laterality: N/A;  Lap Assisted Partial Colectomy  . LAPAROSCOPY  01/27/2012   Procedure: LAPAROSCOPY DIAGNOSTIC;  Surgeon: Pedro Earls, MD;  Location: [facility];  Service: General;  Laterality: N/A;  . UPPER GI ENDOSCOPY  01/27/2012   Procedure: UPPER GI ENDOSCOPY;  Surgeon: Pedro Earls, MD;  Location: [facility];  Service: General;  Laterality: N/A;    Allergies  Allergen Reactions  . Codeine     Nausea    Current Outpatient Medications on File Prior to Visit  Medication Sig Dispense Refill  . Cholecalciferol (VITAMIN D) 2000 UNITS tablet Take by mouth daily. 10,000 with K daily    . Magnesium 200 MG TABS Take by mouth daily.    . naltrexone (DEPADE) 50 MG tablet Take 4.5 mg by mouth daily.    . NON FORMULARY Bio-est 80/20 cream    . NON FORMULARY Total Restore aily    . NON FORMULARY 12.5mg  daily    . NON FORMULARY Bone strength whole food calcium daily    . pantoprazole (PROTONIX) 20 MG tablet TAKE 1 TABLET BY MOUTH TWO TIMES DAILY BEFORE A MEAL 60 tablet 5  . progesterone (PROMETRIUM) 200 MG capsule Take 200 mg by mouth daily.    Marland Kitchen thyroid (ARMOUR) 60 MG tablet Take 60 mg by mouth daily before breakfast.    . Coenzyme Q10-Fish Oil-Vit E (CO-Q 10 OMEGA-3 FISH OIL PO) Take 1 capsule by mouth daily.    . Cyanocobalamin (VITAMIN B-12 SL) Place 1,000 mcg under the tongue daily.     Marland Kitchen  folic acid (FOLVITE) 886 MCG tablet Take 400 mcg by mouth daily.    . Iodine, Kelp, 0.15 MG TABS Take by mouth. 12.5mg     . Probiotic Product (PROBIOTIC DAILY PO) Take by mouth.     No current facility-administered medications on file prior to visit.         Objective:   Physical Exam Blood pressure 132/74, pulse 82, temperature 98.1 F (36.7 C), height 5\' 5"  (1.651 m), weight 148 lb 11.2 oz (67.4 kg). Alert and oriented. Skin warm and dry. Oral mucosa is moist.   . Sclera anicteric, conjunctivae is pink. Thyroid not enlarged. No cervical lymphadenopathy. Lungs clear. Heart regular rate and rhythm.  Abdomen is soft. Bowel sounds are positive. No hepatomegaly. No abdominal  masses felt. No tenderness.  No edema to lower extremities.        Assessment & Plan:  Change in stool. Hx of colon polyp, Will set up for a colonoscopy.

## 2018-07-23 ENCOUNTER — Other Ambulatory Visit (HOSPITAL_COMMUNITY)
Admission: RE | Admit: 2018-07-23 | Discharge: 2018-07-23 | Disposition: A | Discharge status: Home / Self Care | Payer: Medicare Other | Source: Ambulatory Visit | Attending: Internal Medicine | Admitting: Internal Medicine

## 2018-07-23 DIAGNOSIS — Z01812 Encounter for preprocedural laboratory examination: Secondary | ICD-10-CM | POA: Insufficient documentation

## 2018-07-23 DIAGNOSIS — Z1159 Encounter for screening for other viral diseases: Secondary | ICD-10-CM | POA: Diagnosis not present

## 2018-07-23 DIAGNOSIS — R195 Other fecal abnormalities: Secondary | ICD-10-CM

## 2018-07-23 LAB — SARS CORONAVIRUS 2 (TAT 6-24 HRS): SARS Coronavirus 2: NEGATIVE

## 2018-07-25 ENCOUNTER — Ambulatory Visit (HOSPITAL_COMMUNITY)
Admission: RE | Admit: 2018-07-25 | Discharge: 2018-07-25 | Disposition: A | Discharge status: Home / Self Care | Payer: Medicare Other | Attending: Internal Medicine | Admitting: Internal Medicine

## 2018-07-25 ENCOUNTER — Encounter (HOSPITAL_COMMUNITY): Payer: Self-pay | Admitting: *Deleted

## 2018-07-25 ENCOUNTER — Encounter (HOSPITAL_COMMUNITY)
Admission: RE | Disposition: A | Discharge status: Home / Self Care | Payer: Self-pay | Source: Home / Self Care | Attending: Internal Medicine

## 2018-07-25 ENCOUNTER — Other Ambulatory Visit: Payer: Self-pay

## 2018-07-25 DIAGNOSIS — Z885 Allergy status to narcotic agent status: Secondary | ICD-10-CM | POA: Diagnosis not present

## 2018-07-25 DIAGNOSIS — E039 Hypothyroidism, unspecified: Secondary | ICD-10-CM | POA: Diagnosis not present

## 2018-07-25 DIAGNOSIS — Z79890 Hormone replacement therapy (postmenopausal): Secondary | ICD-10-CM | POA: Insufficient documentation

## 2018-07-25 DIAGNOSIS — Z79899 Other long term (current) drug therapy: Secondary | ICD-10-CM | POA: Diagnosis not present

## 2018-07-25 DIAGNOSIS — Z980 Intestinal bypass and anastomosis status: Secondary | ICD-10-CM | POA: Insufficient documentation

## 2018-07-25 DIAGNOSIS — K219 Gastro-esophageal reflux disease without esophagitis: Secondary | ICD-10-CM | POA: Insufficient documentation

## 2018-07-25 DIAGNOSIS — M1990 Unspecified osteoarthritis, unspecified site: Secondary | ICD-10-CM | POA: Diagnosis not present

## 2018-07-25 DIAGNOSIS — R194 Change in bowel habit: Secondary | ICD-10-CM | POA: Insufficient documentation

## 2018-07-25 DIAGNOSIS — K644 Residual hemorrhoidal skin tags: Secondary | ICD-10-CM | POA: Insufficient documentation

## 2018-07-25 DIAGNOSIS — R195 Other fecal abnormalities: Secondary | ICD-10-CM | POA: Insufficient documentation

## 2018-07-25 HISTORY — PX: COLONOSCOPY: SHX5424

## 2018-07-25 SURGERY — COLONOSCOPY
Anesthesia: Moderate Sedation

## 2018-07-25 MED ORDER — MIDAZOLAM HCL 5 MG/5ML IJ SOLN
INTRAMUSCULAR | Status: DC | PRN
  Administered 2018-07-25 (×2): 2 mg via INTRAVENOUS

## 2018-07-25 MED ORDER — MEPERIDINE HCL 50 MG/ML IJ SOLN
INTRAMUSCULAR | Status: DC | PRN
  Administered 2018-07-25 (×2): 25 mg

## 2018-07-25 MED ORDER — MEPERIDINE HCL 50 MG/ML IJ SOLN
INTRAMUSCULAR | Status: AC
  Filled 2018-07-25: qty 1

## 2018-07-25 MED ORDER — STERILE WATER FOR IRRIGATION IR SOLN
Status: DC | PRN
  Administered 2018-07-25: 1.5 mL

## 2018-07-25 MED ORDER — MIDAZOLAM HCL 5 MG/5ML IJ SOLN
INTRAMUSCULAR | Status: AC
  Filled 2018-07-25: qty 10

## 2018-07-25 MED ORDER — SODIUM CHLORIDE 0.9 % IV SOLN
INTRAVENOUS | Status: DC
  Administered 2018-07-25: 08:00:00 via INTRAVENOUS

## 2018-07-25 MED ORDER — BENEFIBER DRINK MIX PO PACK: 4.0000 g | PACK | Freq: Every day | ORAL | Status: DC

## 2018-07-25 NOTE — H&P (Signed)
Pamela Morrow is an 73 y.o. female.   Chief Complaint: Patient is here for colonoscopy. HPI: Patient 73 year old Caucasian female who is here for diagnostic colonoscopy.  Delivered 3 weeks ago she noted a change in her bowel habits.  She went 3 days without a bowel movement.  She took a laxative.  She has noted change in the caliber of her stools.  Stool caliber is decreased.  She denies abdominal pain or rectal bleeding.  She has not changed her eating habits.  She says she has been taking probiotic for about 3 months. Family history is negative for CRC.  Past Medical History:  Diagnosis Date  . Arthritis   . 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 GERD (gastroesophageal reflux disease)   . H/O hiatal hernia   . History of hepatitis    AS TEENAGER - NO RESIDUAL EFFECTS  . Hypothyroid   . Mass of appendix    AND CECAL MASS  . Osteopenia     Past Surgical History:  Procedure Laterality Date  . COLONOSCOPY    . LAPAROSCOPIC PARTIAL COLECTOMY  01/26/2012   Procedure: LAPAROSCOPIC PARTIAL COLECTOMY;  Surgeon: Pedro Earls, MD;  Location: [facility];  Service: General;  Laterality: N/A;  Lap Assisted Partial Colectomy  . LAPAROSCOPY  01/27/2012   Procedure: LAPAROSCOPY DIAGNOSTIC;  Surgeon: Pedro Earls, MD;  Location: [facility];  Service: General;  Laterality: N/A;  . UPPER GI ENDOSCOPY  01/27/2012   Procedure: UPPER GI ENDOSCOPY;  Surgeon: Pedro Earls, MD;  Location: [facility];  Service: General;  Laterality: N/A;    Family History  Problem Relation Age of Onset  . Colitis Sister   . Cancer Sister        cervial  . Cancer Father        esophageal   Social History:  reports that she has never smoked. She has never used smokeless tobacco. She reports current alcohol use. She reports that she does not use drugs.  Allergies:  Allergies  Allergen Reactions  . Codeine Nausea Only    Medications Prior to Admission  Medication Sig Dispense Refill  .  Carboxymethylcellulose Sodium (LUBRICANT EYE DROPS OP) Place 1 drop into both eyes at bedtime. Can-C Lubricant Eye-Drops with Antioxidant N-Acetylcarnosine    . Coenzyme Q10 (COQ10 PO) Take 200 mg by mouth 3 (three) times a week. (Mid-afternoon) CoQ10 and pyrroloquinoline quinone (PQQ).    . Estradiol-Estriol-Progesterone (BIEST/PROGESTERONE) CREA Place 1 application onto the skin at bedtime. Bi-Est 80/20 5mg /gm-Prog 25mg /gm (4 clicks at bedtime)    . FOLIC ACID-VIT Z6-XWR U04 PO Take 1 tablet by mouth daily with lunch.    . Iodine, Kelp, 0.15 MG TABS Take 1 tablet by mouth 3 (three) times a week. Iodoral high potency iodine/potassium iodide supplement    . NALTREXONE HCL PO Take 4.5 mg by mouth at bedtime. Compounded medication by [facility]    . NON FORMULARY Take 3 capsules by mouth daily with lunch. Total Restore Gut Lining Support Blend    . pantoprazole (PROTONIX) 20 MG tablet TAKE 1 TABLET BY MOUTH TWO TIMES DAILY BEFORE A MEAL (Patient taking differently: Take 20 mg by mouth at bedtime. ) 60 tablet 5  . PROGESTERONE MICRONIZED PO Take 100 mg by mouth at bedtime. Progesterone SR    . thyroid (ARMOUR) 60 MG tablet Take 60 mg by mouth daily before breakfast.    . Vitamin D-Vitamin K (VITAMIN K2-VITAMIN D3 PO) Take 1  tablet by mouth daily with lunch. D3 10, 000 with K2 - Vitamin D    . acetaminophen (TYLENOL) 500 MG tablet Take 1,000 mg by mouth every 6 (six) hours as needed (pain.).    . Magnesium 200 MG TABS Take 1 tablet by mouth 3 (three) times a week. With supper    . Multiple Minerals-Vitamins (BONE ESSENTIALS PO) Take 1 tablet by mouth See admin instructions. Take 1 tablet occasionally alternating with Vitamin d3-K2 supplement Bone Strength Whole Food Calcium      No results found for this or any previous visit (from the past 48 hour(s)). No results found.  ROS  Blood pressure 124/77, pulse 73, temperature 98.4 F (36.9 C), temperature source Oral, resp. rate 14, height 5\' 5"  (1.651  m), weight 67.4 kg, SpO2 98 %. Physical Exam  Constitutional: She appears well-developed and well-nourished.  HENT:  Mouth/Throat: Oropharynx is clear and moist.  Eyes: Conjunctivae are normal. No scleral icterus.  Neck: No thyromegaly present.  Cardiovascular: Normal rate, regular rhythm and normal heart sounds.  No murmur heard. Respiratory: Effort normal.  GI:  Abdomen is symmetrical with lower midline scar.  On palpation is soft and nontender with organomegaly or masses.  Musculoskeletal:        General: No edema.  Lymphadenopathy:    She has no cervical adenopathy.  Neurological: She is alert.  Skin: Skin is warm and dry.     Assessment/Plan Change in bowel habits. Diagnostic colonoscopy.  Hildred Laser, MD 07/25/2018, 8:29 AM

## 2018-07-25 NOTE — Progress Notes (Signed)
@  anastomosis at 0844 am  Ericka Pontiff, RN

## 2018-07-25 NOTE — Op Note (Signed)
[facility] Patient Name: Pamela Morrow Procedure Date: 07/25/2018 8:20 AM MRN: [id number] Date of Birth: 04/03/1945 Attending MD: Hildred Laser , MD CSN: [id number] Age: 73 Admit Type: Outpatient Procedure:                Colonoscopy Indications:              Change in bowel habits Providers:                Hildred Laser, MD, Gerome Sam, RN, Nelma Rothman,                            Technician Referring MD:             Virl Son Hairfield, NP Medicines:                Meperidine 50 mg IV, Midazolam 5 mg IV Complications:            No immediate complications. Estimated Blood Loss:     Estimated blood loss: none. Procedure:                Pre-Anesthesia Assessment:                           - Prior to the procedure, a History and Physical                            was performed, and patient medications and                            allergies were reviewed. The patient's tolerance of                            previous anesthesia was also reviewed. The risks                            and benefits of the procedure and the sedation                            options and risks were discussed with the patient.                            All questions were answered, and informed consent                            was obtained. Prior Anticoagulants: The patient has                            taken no previous anticoagulant or antiplatelet                            agents. ASA Grade Assessment: II - A patient with                            mild systemic disease. After reviewing the risks  and benefits, the patient was deemed in                            satisfactory condition to undergo the procedure.                           After obtaining informed consent, the colonoscope                            was passed under direct vision. Throughout the                            procedure, the patient's blood pressure, pulse, and   oxygen saturations were monitored continuously. The                            PCF-[id number] ([id number]) scope was introduced through                            the anus and advanced to the the ileocolonic                            anastomosis. The colonoscopy was performed without                            difficulty. The patient tolerated the procedure                            well. The quality of the bowel preparation was                            excellent. The terminal ileum and the rectum were                            photographed. Scope In: 8:39:44 AM Scope Out: 8:53:07 AM Scope Withdrawal Time: 0 hours 8 minutes 43 seconds  Total Procedure Duration: 0 hours 13 minutes 23 seconds  Findings:      The perianal and digital rectal examinations were normal.      There was evidence of a prior end-to-side ileo-colonic anastomosis at       the hepatic flexure. This was patent and was characterized by healthy       appearing mucosa. The anastomosis was traversed.      The neo-terminal ileum appeared normal.      The colon (entire examined portion) appeared normal.      External hemorrhoids were found during retroflexion. The hemorrhoids       were small. Impression:               - Patent end-to-side ileo-colonic anastomosis,                            characterized by healthy appearing mucosa.                           - The examined portion of the ileum was normal.                           -  The entire examined colon is normal.                           - External hemorrhoids.                           - No specimens collected. Moderate Sedation:      Moderate (conscious) sedation was administered by the endoscopy nurse       and supervised by the endoscopist. The following parameters were       monitored: oxygen saturation, heart rate, blood pressure, CO2       capnography and response to care. Total physician intraservice time was       18 minutes. Recommendation:           -  Patient has a contact number available for                            emergencies. The signs and symptoms of potential                            delayed complications were discussed with the                            patient. Return to normal activities tomorrow.                            Written discharge instructions were provided to the                            patient.                           - High fiber diet today.                           - Continue present medications.                           - Benefiber 4 g po qhs.                           - No repeat colonoscopy due to age and the absence                            of advanced adenomas. Procedure Code(s):        --- Professional ---                           [phone], Colonoscopy, flexible; diagnostic, including                            collection of specimen(s) by brushing or washing,                            when performed (separate procedure)  G0500, Moderate sedation services provided by the                            same physician or other qualified health care                            professional performing a gastrointestinal                            endoscopic service that sedation supports,                            requiring the presence of an independent trained                            observer to assist in the monitoring of the                            patient's level of consciousness and physiological                            status; initial 15 minutes of intra-service time;                            patient age 93 years or older (additional time may                            be reported with [phone], as appropriate) Diagnosis Code(s):        --- Professional ---                           Z98.0, Intestinal bypass and anastomosis status                           K64.4, Residual hemorrhoidal skin tags                           R19.4, Change in bowel habit CPT copyright  2019 American Medical Association. All rights reserved. The codes documented in this report are preliminary and upon coder review may  be revised to meet current compliance requirements. Hildred Laser, MD Hildred Laser, MD 07/25/2018 9:05:47 AM This report has been signed electronically. Number of Addenda: 0

## 2018-07-25 NOTE — Discharge Instructions (Signed)
Resume usual medications as before. Benefiber or equivalent 4 g by mouth daily at bedtime. High-fiber diet. No driving for 24 hours.  Colonoscopy, Adult, Care After This sheet gives you information about how to care for yourself after your procedure. Your doctor may also give you more specific instructions. If you have problems or questions, call your doctor. What can I expect after the procedure? After the procedure, it is common to have:  A small amount of blood in your poop for 24 hours.  Some gas.  Mild cramping or bloating in your belly. Follow these instructions at home: General instructions  For the first 24 hours after the procedure: ? Do not drive or use machinery. ? Do not sign important documents. ? Do not drink alcohol. ? Do your daily activities more slowly than normal. ? Eat foods that are soft and easy to digest.  Take over-the-counter or prescription medicines only as told by your doctor. To help cramping and bloating:   Try walking around.  Put heat on your belly (abdomen) as told by your doctor. Use a heat source that your doctor recommends, such as a moist heat pack or a heating pad. ? Put a towel between your skin and the heat source. ? Leave the heat on for 20-30 minutes. ? Remove the heat if your skin turns bright red. This is especially important if you cannot feel pain, heat, or cold. You can get burned. Eating and drinking   Drink enough fluid to keep your pee (urine) clear or pale yellow.  Return to your normal diet as told by your doctor. Avoid heavy or fried foods that are hard to digest.  Avoid drinking alcohol for as long as told by your doctor. Contact a doctor if:  You have blood in your poop (stool) 2-3 days after the procedure. Get help right away if:  You have more than a small amount of blood in your poop.  You see large clumps of tissue (blood clots) in your poop.  Your belly is swollen.  You feel sick to your stomach  (nauseous).  You throw up (vomit).  You have a fever.  You have belly pain that gets worse, and medicine does not help your pain. Summary  After the procedure, it is common to have a small amount of blood in your poop. You may also have mild cramping and bloating in your belly.  For the first 24 hours after the procedure, do not drive or use machinery, do not sign important documents, and do not drink alcohol.  Get help right away if you have a lot of blood in your poop, feel sick to your stomach, have a fever, or have more belly pain. This information is not intended to replace advice given to you by your health care provider. Make sure you discuss any questions you have with your health care provider. Document Released: 02/04/2010 Document Revised: 11/02/2016 Document Reviewed: 09/27/2015 Elsevier Patient Education  2020 [employer].  Hemorrhoids Hemorrhoids are swollen veins in and around the rectum or anus. There are two types of hemorrhoids:  Internal hemorrhoids. These occur in the veins that are just inside the rectum. They may poke through to the outside and become irritated and painful.  External hemorrhoids. These occur in the veins that are outside the anus and can be felt as a painful swelling or hard lump near the anus. Most hemorrhoids do not cause serious problems, and they can be managed with home treatments such as diet  and lifestyle changes. If home treatments do not help the symptoms, procedures can be done to shrink or remove the hemorrhoids. What are the causes? This condition is caused by increased pressure in the anal area. This pressure may result from various things, including:  Constipation.  Straining to have a bowel movement.  Diarrhea.  Pregnancy.  Obesity.  Sitting for long periods of time.  Heavy lifting or other activity that causes you to strain.  Anal sex.  Riding a bike for a long period of time. What are the signs or  symptoms? Symptoms of this condition include:  Pain.  Anal itching or irritation.  Rectal bleeding.  Leakage of stool (feces).  Anal swelling.  One or more lumps around the anus. How is this diagnosed? This condition can often be diagnosed through a visual exam. Other exams or tests may also be done, such as:  An exam that involves feeling the rectal area with a gloved hand (digital rectal exam).  An exam of the anal canal that is done using a small tube (anoscope).  A blood test, if you have lost a significant amount of blood.  A test to look inside the colon using a flexible tube with a camera on the end (sigmoidoscopy or colonoscopy). How is this treated? This condition can usually be treated at home. However, various procedures may be done if dietary changes, lifestyle changes, and other home treatments do not help your symptoms. These procedures can help make the hemorrhoids smaller or remove them completely. Some of these procedures involve surgery, and others do not. Common procedures include:  Rubber band ligation. Rubber bands are placed at the base of the hemorrhoids to cut off their blood supply.  Sclerotherapy. Medicine is injected into the hemorrhoids to shrink them.  Infrared coagulation. A type of light energy is used to get rid of the hemorrhoids.  Hemorrhoidectomy surgery. The hemorrhoids are surgically removed, and the veins that supply them are tied off.  Stapled hemorrhoidopexy surgery. The surgeon staples the base of the hemorrhoid to the rectal wall. Follow these instructions at home: Eating and drinking   Eat foods that have a lot of fiber in them, such as whole grains, beans, nuts, fruits, and vegetables.  Ask your health care provider about taking products that have added fiber (fiber supplements).  Reduce the amount of fat in your diet. You can do this by eating low-fat dairy products, eating less red meat, and avoiding processed foods.  Drink  enough fluid to keep your urine pale yellow. Managing pain and swelling   Take warm sitz baths for 20 minutes, 3-4 times a day to ease pain and discomfort. You may do this in a bathtub or using a portable sitz bath that fits over the toilet.  If directed, apply ice to the affected area. Using ice packs between sitz baths may be helpful. ? Put ice in a plastic bag. ? Place a towel between your skin and the bag. ? Leave the ice on for 20 minutes, 2-3 times a day. General instructions  Take over-the-counter and prescription medicines only as told by your health care provider.  Use medicated creams or suppositories as told.  Get regular exercise. Ask your health care provider how much and what kind of exercise is best for you. In general, you should do moderate exercise for at least 30 minutes on most days of the week (150 minutes each week). This can include activities such as walking, biking, or yoga.  Go  to the bathroom when you have the urge to have a bowel movement. Do not wait.  Avoid straining to have bowel movements.  Keep the anal area dry and clean. Use wet toilet paper or moist towelettes after a bowel movement.  Do not sit on the toilet for long periods of time. This increases blood pooling and pain.  Keep all follow-up visits as told by your health care provider. This is important. Contact a health care provider if you have:  Increasing pain and swelling that are not controlled by treatment or medicine.  Difficulty having a bowel movement, or you are unable to have a bowel movement.  Pain or inflammation outside the area of the hemorrhoids. Get help right away if you have:  Uncontrolled bleeding from your rectum. Summary  Hemorrhoids are swollen veins in and around the rectum or anus.  Most hemorrhoids can be managed with home treatments such as diet and lifestyle changes.  Taking warm sitz baths can help ease pain and discomfort.  In severe cases, procedures or  surgery can be done to shrink or remove the hemorrhoids. This information is not intended to replace advice given to you by your health care provider. Make sure you discuss any questions you have with your health care provider. Document Released: 12/31/1999 Document Revised: 01/10/2018 Document Reviewed: 05/24/2017 Elsevier Patient Education  2020 [employer].

## 2018-07-30 ENCOUNTER — Encounter (HOSPITAL_COMMUNITY): Payer: Self-pay | Admitting: Internal Medicine

## 2020-10-13 ENCOUNTER — Ambulatory Visit: Payer: Self-pay | Admitting: Surgery

## 2020-10-13 DIAGNOSIS — N6310 Unspecified lump in the right breast, unspecified quadrant: Secondary | ICD-10-CM

## 2020-10-20 ENCOUNTER — Other Ambulatory Visit: Payer: Self-pay | Admitting: Surgery

## 2020-10-20 DIAGNOSIS — N6310 Unspecified lump in the right breast, unspecified quadrant: Secondary | ICD-10-CM

## 2020-10-28 ENCOUNTER — Encounter: Payer: Self-pay | Admitting: *Deleted

## 2020-10-28 ENCOUNTER — Other Ambulatory Visit: Payer: Self-pay

## 2020-10-28 ENCOUNTER — Encounter: Payer: Self-pay | Admitting: Cardiology

## 2020-10-28 ENCOUNTER — Ambulatory Visit (INDEPENDENT_AMBULATORY_CARE_PROVIDER_SITE_OTHER): Payer: Medicare Other | Admitting: Cardiology

## 2020-10-28 VITALS — BP 126/70 | HR 83 | Ht 65.0 in | Wt 156.6 lb

## 2020-10-28 DIAGNOSIS — R0789 Other chest pain: Secondary | ICD-10-CM | POA: Diagnosis not present

## 2020-10-28 DIAGNOSIS — R0602 Shortness of breath: Secondary | ICD-10-CM | POA: Diagnosis not present

## 2020-10-28 NOTE — Progress Notes (Signed)
Cardiology Office Note  Date: 10/28/2020   ID: Pamela Morrow, DOB [birth year], MRN [id number]  PCP:  Bryson Corona, NP  Cardiologist:  Rozann Lesches, MD Electrophysiologist:  None   Chief Complaint  Patient presents with   Chest Pain     History of Present Illness: Pamela Morrow is a 75 y.o. female referred for cardiology consultation by Ms. Briles NP with [employer] in [place] for the evaluation of chest discomfort.  I reviewed her records.  She tells me that she officially lives in [place] with her husband but has a house locally in [place].  Over the last 6 months she has noticed a recurring dull ache in her chest, central to left-sided, no specific precipitant or pattern.  This has not been purely exertional.  She has noted over the same time.  Somewhat of a sense of shortness of breath with activity such as walking for exercise or walking up and down her basement stairs more than once.  She also intermittently senses a forceful heartbeat and rarely a skip.  She has not had any sudden dizziness or syncope.  No prolonged sense of palpitations.  She states that she is concerned about potential effects of having had the COVID-19 vaccine, she got 2 doses, no booster.  Does not sound like she had any unusual problems at the time of her vaccinations.  She tells me that she has done a lot of reading and is concerned about long-term effects of the spike protein.  She is also in the process of management of a benign right breast mass, plans resection of this in December.  I personally reviewed her ECG today which shows normal sinus rhythm with leftward axis/left anterior fascicular block.  There are no old tracings for comparison.  I did review her lab work from July.  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   Menopausal state    Mixed connective tissue disease ([facility])    Osteopenia     Past Surgical History:  Procedure Laterality Date   COLONOSCOPY     COLONOSCOPY N/A 07/25/2018   Procedure: COLONOSCOPY;  Surgeon: Rogene Houston, MD;  Location: [facility];  Service: Endoscopy;  Laterality: N/A;  8:30   LAPAROSCOPIC PARTIAL COLECTOMY  01/26/2012   Procedure: LAPAROSCOPIC PARTIAL COLECTOMY;  Surgeon: Pedro Earls, MD;  Location: [facility];  Service: General;  Laterality: N/A;  Lap Assisted Partial Colectomy   LAPAROSCOPY  01/27/2012   Procedure: LAPAROSCOPY DIAGNOSTIC;  Surgeon: Pedro Earls, MD;  Location: [facility];  Service: General;  Laterality: N/A;   UPPER GI ENDOSCOPY  01/27/2012   Procedure: UPPER GI ENDOSCOPY;  Surgeon: Pedro Earls, MD;  Location: [facility];  Service: General;  Laterality: N/A;    Current Outpatient Medications  Medication Sig Dispense Refill   acetaminophen (TYLENOL) 500 MG tablet Take 1,000 mg by mouth every 6 (six) hours as needed (pain.).     Coenzyme Q10 (COQ10 PO) Take 200 mg by mouth 3 (three) times a week. (Mid-afternoon) CoQ10 and pyrroloquinoline quinone (PQQ).     FOLIC ACID-VIT U3-AGT X64 PO Take 1 tablet by  mouth daily with lunch.     Iodine, Kelp, 0.15 MG TABS Take 1 tablet by mouth 3 (three) times a week. Iodoral high potency iodine/potassium iodide supplement     Magnesium 200 MG TABS Take 1 tablet by mouth 3 (three) times a week. With supper     Multiple Minerals-Vitamins (BONE ESSENTIALS PO) Take 1 tablet by mouth See admin instructions. Take 1 tablet occasionally alternating with Vitamin d3-K2 supplement Bone Strength Whole Food Calcium     NALTREXONE HCL PO Take 4.5 mg by mouth at bedtime. Compounded medication by [facility]     NON FORMULARY Take 3 capsules by mouth daily with lunch. Total Restore Gut Lining Support Blend      thyroid (ARMOUR) 30 MG tablet Take 30 mg by mouth daily before breakfast.     Vitamin D-Vitamin K (VITAMIN K2-VITAMIN D3 PO) Take 1 tablet by mouth daily with lunch. D3 10, 000 with K2 - Vitamin D     Estradiol-Estriol-Progesterone (BIEST/PROGESTERONE) CREA Place 1 application onto the skin at bedtime. Bi-Est 80/20 5mg /gm-Prog 25mg /gm (4 clicks at bedtime) (Patient not taking: Reported on 10/28/2020)     pantoprazole (PROTONIX) 20 MG tablet TAKE 1 TABLET BY MOUTH TWO TIMES DAILY BEFORE A MEAL (Patient not taking: Reported on 10/28/2020) 60 tablet 5   PROGESTERONE MICRONIZED PO Take 100 mg by mouth at bedtime. Progesterone SR (Patient not taking: Reported on 10/28/2020)     Wheat Dextrin (BENEFIBER DRINK MIX) PACK Take 4 g by mouth at bedtime. (Patient not taking: Reported on 10/28/2020)     No current facility-administered medications for this visit.   Allergies:  Codeine   Social History: The patient  reports that she has never smoked. She has never used smokeless tobacco. She reports current alcohol use. She reports that she does not use drugs.   Family History: The patient's family history includes Cancer in her father and sister; Colitis in her sister.   ROS: No orthopnea or PND.  Does have intermittent mild ankle edema.  Physical Exam: VS:  BP 126/70   Pulse 83   Ht 5\' 5"  (1.651 m)   Wt 156 lb 9.6 oz (71 kg)   SpO2 98%   BMI 26.06 kg/m , BMI Body mass index is 26.06 kg/m.  Wt Readings from Last 3 Encounters:  10/28/20 156 lb 9.6 oz (71 kg)  07/25/18 148 lb 11.2 oz (67.4 kg)  07/22/18 148 lb 11.2 oz (67.4 kg)    General: Patient appears comfortable at rest. HEENT: Conjunctiva and lids normal, wearing a mask. Neck: Supple, no elevated JVP or carotid bruits, no thyromegaly. Lungs: Clear to auscultation, nonlabored breathing at rest. Cardiac: Regular rate and rhythm, no S3 or significant systolic murmur, no pericardial rub. Abdomen: Bowel sounds present. Extremities: No pitting  edema, distal pulses 2+. Skin: Warm and dry. Musculoskeletal: No kyphosis. Neuropsychiatric: Alert and oriented x3, affect grossly appropriate.  ECG:   No old tracings for review today.  Recent Labwork:  July 2022: TSH 1.44, CRP 2.13 hemoglobin A1c 5.7% BUN 13, creatinine 0.81, potassium 4.3, AST 19, ALT 14, cholesterol 196, triglycerides 102, HDL 47, LDL 131, hemoglobin 14.0, platelets 260  Other Studies Reviewed Today:  No prior cardiac testing for review today.  Assessment and Plan:  Atypical chest pain as discussed above, also sensation of relative shortness of breath with activities over the last 6 months, intermittent mild ankle edema.  ECG shows normal sinus rhythm.  TSH and CRP were normal in July.  LDL  131 at that time.  No history of hypertension or type 2 diabetes mellitus.  Blood pressure is reasonable today.  We have discussed proceeding with an echocardiogram to assess cardiac structure and function, also a GXT as a screening for ischemic heart disease.  She plans to schedule this at her convenience depending on travel to and from [place].  Would like to get this done before her breast surgery in December.  Medication Adjustments/Labs and Tests Ordered: Current medicines are reviewed at length with the patient today.  Concerns regarding medicines are outlined above.   Tests Ordered: Orders Placed This Encounter  Procedures   EXERCISE TOLERANCE TEST (ETT)   EKG 12-Lead   ECHOCARDIOGRAM COMPLETE     Medication Changes: No orders of the defined types were placed in this encounter.   Disposition:  Follow up  test results.  Signed, Satira Sark, MD, [facility] 10/28/2020 11:11 AM    [facility] [place], [place], [place] [address] Phone: [phone]; Fax: [phone]

## 2020-10-28 NOTE — Patient Instructions (Addendum)
Medication Instructions:  Your physician recommends that you continue on your current medications as directed. Please refer to the Current Medication list given to you today.  Labwork: none  Testing/Procedures: Your physician has requested that you have an echocardiogram. Echocardiography is a painless test that uses sound waves to create images of your heart. It provides your doctor with information about the size and shape of your heart and how well your heart's chambers and valves are working. This procedure takes approximately one hour. There are no restrictions for this procedure. Your physician has requested that you have an exercise tolerance test. For further information please visit [email or web address]. Please also follow instruction sheet, as given.  Follow-Up: Your physician recommends that you schedule a follow-up appointment in: pending  Any Other Special Instructions Will Be Listed Below (If Applicable).  If you need a refill on your cardiac medications before your next appointment, please call your pharmacy.

## 2020-11-22 ENCOUNTER — Telehealth: Payer: Self-pay | Admitting: Cardiology

## 2020-11-22 NOTE — Telephone Encounter (Signed)
Checking percert on the following patient for testing scheduled at [facility].     GXT ECHO   12/13/2020

## 2020-12-08 ENCOUNTER — Encounter (HOSPITAL_BASED_OUTPATIENT_CLINIC_OR_DEPARTMENT_OTHER): Payer: Self-pay | Admitting: Surgery

## 2020-12-13 ENCOUNTER — Ambulatory Visit (HOSPITAL_COMMUNITY)
Admission: RE | Admit: 2020-12-13 | Discharge: 2020-12-13 | Disposition: A | Discharge status: Home / Self Care | Payer: Medicare Other | Source: Ambulatory Visit | Attending: Cardiology | Admitting: Cardiology

## 2020-12-13 ENCOUNTER — Ambulatory Visit (HOSPITAL_BASED_OUTPATIENT_CLINIC_OR_DEPARTMENT_OTHER)
Admission: RE | Admit: 2020-12-13 | Discharge: 2020-12-13 | Disposition: A | Discharge status: Home / Self Care | Payer: Medicare Other | Source: Ambulatory Visit | Attending: Cardiology | Admitting: Cardiology

## 2020-12-13 ENCOUNTER — Telehealth: Payer: Self-pay | Admitting: Cardiology

## 2020-12-13 DIAGNOSIS — R0602 Shortness of breath: Secondary | ICD-10-CM | POA: Insufficient documentation

## 2020-12-13 DIAGNOSIS — R0789 Other chest pain: Secondary | ICD-10-CM | POA: Insufficient documentation

## 2020-12-13 LAB — EXERCISE TOLERANCE TEST
Angina Index: 0
Estimated workload: 7
Exercise duration (min): 6 min
Exercise duration (sec): 0 s
MPHR: 147 {beats}/min
Peak HR: 164 {beats}/min
Percent HR: 111 %
RPE: 14
Rest HR: 73 {beats}/min

## 2020-12-13 LAB — ECHOCARDIOGRAM COMPLETE
Area-P 1/2: 2.37 cm2
S' Lateral: 2.3 cm

## 2020-12-13 NOTE — Telephone Encounter (Signed)
I discussed results with patient,copied pcp

## 2020-12-13 NOTE — Telephone Encounter (Signed)
Chevi is returning Cathey's call about her results.

## 2020-12-13 NOTE — Progress Notes (Signed)
*  PRELIMINARY RESULTS* Echocardiogram 2D Echocardiogram has been performed.  Pamela Morrow 12/13/2020, 11:07 AM

## 2020-12-14 ENCOUNTER — Telehealth: Payer: Self-pay

## 2020-12-14 DIAGNOSIS — R9439 Abnormal result of other cardiovascular function study: Secondary | ICD-10-CM

## 2020-12-14 NOTE — Telephone Encounter (Signed)
Discussed GXT results with patient. She would like exercise myoview at [facility]. Order entered, instructions for test given to patient.

## 2020-12-14 NOTE — Progress Notes (Signed)
Dr Sabra Heck reviewed results of ECHO and ETT. He states that ETT is inconclusive and further testing is being recommended by cardiology. Pt would be better served being done at [place] per Dr Sabra Heck. Ivy at Dr Earlie Server office notified.

## 2020-12-14 NOTE — Telephone Encounter (Signed)
-----   Message from Satira Sark, MD sent at 12/13/2020  4:16 PM EST ----- Results reviewed.  Treadmill test showed equivocal ST segment abnormalities with reasonable workload and adequate heart rate response.  This technically falls in an intermediate risk category and therefore as a screening test is not conclusive.  We could either consider having her undergo an exercise Myoview (at [facility]) which would give [place] additional imaging information on top of the treadmill results, or schedule her for a coronary CTA (in [facility]) for evaluation of coronary anatomy.

## 2020-12-16 ENCOUNTER — Ambulatory Visit (HOSPITAL_COMMUNITY)
Admission: RE | Admit: 2020-12-16 | Discharge: 2020-12-16 | Disposition: A | Discharge status: Home / Self Care | Payer: Medicare Other | Source: Ambulatory Visit | Attending: Cardiology | Admitting: Cardiology

## 2020-12-16 ENCOUNTER — Other Ambulatory Visit: Payer: Self-pay

## 2020-12-16 DIAGNOSIS — R9439 Abnormal result of other cardiovascular function study: Secondary | ICD-10-CM | POA: Diagnosis not present

## 2020-12-16 LAB — NM MYOCAR MULTI W/SPECT W/WALL MOTION / EF
Angina Index: 0
Duke Treadmill Score: 3
Estimated workload: 4.6
Exercise duration (min): 3 min
Exercise duration (sec): 4 s
LV dias vol: 54 mL (ref 46–106)
LV sys vol: 14 mL
MPHR: 147 {beats}/min
Nuc Stress EF: 74 %
Peak HR: 141 {beats}/min
Percent HR: 95 %
RATE: 0.4
RPE: 11
Rest HR: 88 {beats}/min
Rest Nuclear Isotope Dose: 11 mCi
SDS: 2
SRS: 1
SSS: 3
ST Depression (mm): 0.5 mm
Stress Nuclear Isotope Dose: 31 mCi

## 2020-12-16 MED ORDER — TECHNETIUM TC 99M TETROFOSMIN IV KIT
30.0000 | PACK | Freq: Once | INTRAVENOUS | Status: AC | PRN
  Administered 2020-12-16: 31 via INTRAVENOUS

## 2020-12-16 MED ORDER — REGADENOSON 0.4 MG/5ML IV SOLN
INTRAVENOUS | Status: AC
  Filled 2020-12-16: qty 5

## 2020-12-16 MED ORDER — SODIUM CHLORIDE FLUSH 0.9 % IV SOLN
INTRAVENOUS | Status: AC
  Administered 2020-12-16: 10 mL via INTRAVENOUS
  Filled 2020-12-16: qty 10

## 2020-12-16 MED ORDER — TECHNETIUM TC 99M TETROFOSMIN IV KIT
10.0000 | PACK | Freq: Once | INTRAVENOUS | Status: AC | PRN
  Administered 2020-12-16: 11 via INTRAVENOUS

## 2020-12-29 NOTE — Pre-Procedure Instructions (Signed)
Surgical Instructions    Your procedure is scheduled on Friday 12/31/20.   Report to [facility] Main Entrance "A" at 10:00 A.M., then check in with the Admitting office.  Call this number if you have problems the morning of surgery:  [phone]   If you have any questions prior to your surgery date call [phone]: Open Monday-Friday 8am-4pm    Remember:  Do not eat after midnight the night before your surgery  You may drink clear liquids until 09:00 A.M. the morning of your surgery.   Clear liquids allowed are: Water, Non-Citrus Juices (without pulp), Carbonated Beverages, Clear Tea, Black Coffee ONLY (NO MILK, CREAM OR POWDERED CREAMER of any kind), and Gatorade    Take these medicines the morning of surgery with A SIP OF WATER   NONE  As of today, STOP taking any Aspirin (unless otherwise instructed by your surgeon) Aleve, Naproxen, Ibuprofen, Motrin, Advil, Goody's, BC's, all herbal medications, fish oil, and all vitamins.     After your COVID test   You are not required to quarantine however you are required to wear a well-fitting mask when you are out and around people not in your household.  If your mask becomes wet or soiled, replace with a new one.  Wash your hands often with soap and water for 20 seconds or clean your hands with an alcohol-based hand sanitizer that contains at least 60% alcohol.  Do not share personal items.  Notify your provider: if you are in close contact with someone who has COVID  or if you develop a fever of 100.4 or greater, sneezing, cough, sore throat, shortness of breath or body aches.             Do not wear jewelry or makeup Do not wear lotions, powders, perfumes/colognes, or deodorant. Do not shave 48 hours prior to surgery.  Men may shave face and neck. Do not bring valuables to the hospital. DO Not wear nail polish, gel polish, artificial nails, or any other type of covering on natural nails including finger and toenails. If  patients have artificial nails, gel coating, etc. that need to be removed by a nail salon, please have this removed prior to surgery or surgery may need to be canceled/delayed if the surgeon/ anesthesia feels like the patient is unable to be adequately monitored.             [place] is not responsible for any belongings or valuables.  Do NOT Smoke (Tobacco/Vaping)  24 hours prior to your procedure  If you use a CPAP at night, you may bring your mask for your overnight stay.   Contacts, glasses, hearing aids, dentures or partials may not be worn into surgery, please bring cases for these belongings   For patients admitted to the hospital, discharge time will be determined by your treatment team.   Patients discharged the day of surgery will not be allowed to drive home, and someone needs to stay with them for 24 hours.  NO VISITORS WILL BE ALLOWED IN PRE-OP WHERE PATIENTS ARE PREPPED FOR SURGERY.  ONLY 1 SUPPORT PERSON MAY BE PRESENT IN THE WAITING ROOM WHILE YOU ARE IN SURGERY.  IF YOU ARE TO BE ADMITTED, ONCE YOU ARE IN YOUR ROOM YOU WILL BE ALLOWED TWO (2) VISITORS. 1 (ONE) VISITOR MAY STAY OVERNIGHT BUT MUST ARRIVE TO THE ROOM BY 8pm.  Minor children may have two parents present. Special consideration for safety and communication needs will be reviewed on a  case by case basis.  Special instructions:    Oral Hygiene is also important to reduce your risk of infection.  Remember - BRUSH YOUR TEETH THE MORNING OF SURGERY WITH YOUR REGULAR TOOTHPASTE   [place]- Preparing For Surgery  Before surgery, you can play an important role. Because skin is not sterile, your skin needs to be as free of germs as possible. You can reduce the number of germs on your skin by washing with CHG (chlorahexidine gluconate) Soap before surgery.  CHG is an antiseptic cleaner which kills germs and bonds with the skin to continue killing germs even after washing.     Please do not use if you have an allergy  to CHG or antibacterial soaps. If your skin becomes reddened/irritated stop using the CHG.  Do not shave (including legs and underarms) for at least 48 hours prior to first CHG shower. It is OK to shave your face.  Please follow these instructions carefully.     Shower the NIGHT BEFORE SURGERY and the MORNING OF SURGERY with CHG Soap.   If you chose to wash your hair, wash your hair first as usual with your normal shampoo. After you shampoo, rinse your hair and body thoroughly to remove the shampoo.  Then [employer] and genitals (private parts) with your normal soap and rinse thoroughly to remove soap.  After that Use CHG Soap as you would any other liquid soap. You can apply CHG directly to the skin and wash gently with a scrungie or a clean washcloth.   Apply the CHG Soap to your body ONLY FROM THE NECK DOWN.  Do not use on open wounds or open sores. Avoid contact with your eyes, ears, mouth and genitals (private parts). Wash Face and genitals (private parts)  with your normal soap.   Wash thoroughly, paying special attention to the area where your surgery will be performed.  Thoroughly rinse your body with warm water from the neck down.  DO NOT shower/wash with your normal soap after using and rinsing off the CHG Soap.  Pat yourself dry with a CLEAN TOWEL.  Wear CLEAN PAJAMAS to bed the night before surgery  Place CLEAN SHEETS on your bed the night before your surgery  DO NOT SLEEP WITH PETS.   Day of Surgery:  Take a shower with CHG soap. Wear Clean/Comfortable clothing the morning of surgery Do not apply any deodorants/lotions.   Remember to brush your teeth WITH YOUR REGULAR TOOTHPASTE.   Please read over the following fact sheets that you were given.

## 2020-12-30 ENCOUNTER — Other Ambulatory Visit: Payer: Self-pay | Admitting: Surgery

## 2020-12-30 ENCOUNTER — Ambulatory Visit
Admission: RE | Admit: 2020-12-30 | Discharge: 2020-12-30 | Disposition: A | Discharge status: Home / Self Care | Payer: Medicare Other | Source: Ambulatory Visit | Attending: Surgery | Admitting: Surgery

## 2020-12-30 ENCOUNTER — Encounter (HOSPITAL_COMMUNITY): Payer: Self-pay

## 2020-12-30 ENCOUNTER — Other Ambulatory Visit: Payer: Self-pay

## 2020-12-30 ENCOUNTER — Encounter (HOSPITAL_COMMUNITY)
Admission: RE | Admit: 2020-12-30 | Discharge: 2020-12-30 | Disposition: A | Discharge status: Home / Self Care | Payer: Medicare Other | Source: Ambulatory Visit | Attending: Surgery | Admitting: Surgery

## 2020-12-30 VITALS — BP 124/64 | HR 84 | Temp 97.8°F | Resp 17 | Ht 65.0 in | Wt 133.6 lb

## 2020-12-30 DIAGNOSIS — K219 Gastro-esophageal reflux disease without esophagitis: Secondary | ICD-10-CM | POA: Insufficient documentation

## 2020-12-30 DIAGNOSIS — E039 Hypothyroidism, unspecified: Secondary | ICD-10-CM | POA: Diagnosis not present

## 2020-12-30 DIAGNOSIS — N6310 Unspecified lump in the right breast, unspecified quadrant: Secondary | ICD-10-CM | POA: Diagnosis not present

## 2020-12-30 DIAGNOSIS — Z01818 Encounter for other preprocedural examination: Secondary | ICD-10-CM

## 2020-12-30 DIAGNOSIS — Z01812 Encounter for preprocedural laboratory examination: Secondary | ICD-10-CM | POA: Diagnosis present

## 2020-12-30 LAB — CBC
HCT: 41.8 % (ref 36.0–46.0)
Hemoglobin: 13.6 g/dL (ref 12.0–15.0)
MCH: 33.2 pg (ref 26.0–34.0)
MCHC: 32.5 g/dL (ref 30.0–36.0)
MCV: 102 fL — ABNORMAL HIGH (ref 80.0–100.0)
Platelets: 248 10*3/uL (ref 150–400)
RBC: 4.1 MIL/uL (ref 3.87–5.11)
RDW: 11.9 % (ref 11.5–15.5)
WBC: 4.5 10*3/uL (ref 4.0–10.5)
nRBC: 0 % (ref 0.0–0.2)

## 2020-12-30 LAB — BASIC METABOLIC PANEL
Anion gap: 6 (ref 5–15)
BUN: 11 mg/dL (ref 8–23)
CO2: 28 mmol/L (ref 22–32)
Calcium: 9.3 mg/dL (ref 8.9–10.3)
Chloride: 104 mmol/L (ref 98–111)
Creatinine, Ser: 0.76 mg/dL (ref 0.44–1.00)
GFR, Estimated: >60 mL/min (ref >60)
Glucose, Bld: 89 mg/dL (ref 70–99)
Potassium: 3.9 mmol/L (ref 3.5–5.1)
Sodium: 138 mmol/L (ref 135–145)

## 2020-12-30 NOTE — Pre-Procedure Instructions (Signed)
Surgical Instructions    Your procedure is scheduled on Friday 12/31/20.   Report to [facility] Main Entrance "A" at 10:00 A.M., then check in with the Admitting office.  Call this number if you have problems the morning of surgery:  [phone]   If you have any questions prior to your surgery date call [phone]: Open Monday-Friday 8am-4pm    Remember:  Do not eat after midnight the night before your surgery  You may drink clear liquids until 09:00 A.M. the morning of your surgery.   Clear liquids allowed are: Water, Non-Citrus Juices (without pulp), Carbonated Beverages, Clear Tea, Black Coffee ONLY (NO MILK, CREAM OR POWDERED CREAMER of any kind), and Gatorade    Take these medicines the morning of surgery with A SIP OF WATER:   Thyroid (ARMOUR)  3 Days prior to surgery, STOP NALTREXONE.  As of today, STOP taking any Aspirin (unless otherwise instructed by your surgeon) Aleve, Naproxen, Ibuprofen, Motrin, Advil, Goody's, BC's, all herbal medications, fish oil, and all vitamins.    After your COVID test   You are not required to quarantine however you are required to wear a well-fitting mask when you are out and around people not in your household.  If your mask becomes wet or soiled, replace with a new one.  Wash your hands often with soap and water for 20 seconds or clean your hands with an alcohol-based hand sanitizer that contains at least 60% alcohol.  Do not share personal items.  Notify your provider: if you are in close contact with someone who has COVID  or if you develop a fever of 100.4 or greater, sneezing, cough, sore throat, shortness of breath or body aches.           Do not wear jewelry or makeup Do not wear lotions, powders, perfumes/colognes, or deodorant. Do not shave 48 hours prior to surgery.  Do not bring valuables to the hospital. DO Not wear nail polish, gel polish, artificial nails, or any other type of covering on natural nails including  finger and toenails. If patients have artificial nails, gel coating, etc. that need to be removed by a nail salon, please have this removed prior to surgery or surgery may need to be canceled/delayed if the surgeon/ anesthesia feels like the patient is unable to be adequately monitored.             [place] is not responsible for any belongings or valuables.  Do NOT Smoke (Tobacco/Vaping)  24 hours prior to your procedure  If you use a CPAP at night, you may bring your mask for your overnight stay.   Contacts, glasses, hearing aids, dentures or partials may not be worn into surgery, please bring cases for these belongings   For patients admitted to the hospital, discharge time will be determined by your treatment team.   Patients discharged the day of surgery will not be allowed to drive home, and someone needs to stay with them for 24 hours.  NO VISITORS WILL BE ALLOWED IN PRE-OP WHERE PATIENTS ARE PREPPED FOR SURGERY.  ONLY 1 SUPPORT PERSON MAY BE PRESENT IN THE WAITING ROOM WHILE YOU ARE IN SURGERY.  IF YOU ARE TO BE ADMITTED, ONCE YOU ARE IN YOUR ROOM YOU WILL BE ALLOWED TWO (2) VISITORS. 1 (ONE) VISITOR MAY STAY OVERNIGHT BUT MUST ARRIVE TO THE ROOM BY 8pm.  Minor children may have two parents present. Special consideration for safety and communication needs will be reviewed on a  case by case basis.  Special instructions:    Oral Hygiene is also important to reduce your risk of infection.  Remember - BRUSH YOUR TEETH THE MORNING OF SURGERY WITH YOUR REGULAR TOOTHPASTE   [place]- Preparing For Surgery  Before surgery, you can play an important role. Because skin is not sterile, your skin needs to be as free of germs as possible. You can reduce the number of germs on your skin by washing with CHG (chlorahexidine gluconate) Soap before surgery.  CHG is an antiseptic cleaner which kills germs and bonds with the skin to continue killing germs even after washing.     Please do not  use if you have an allergy to CHG or antibacterial soaps. If your skin becomes reddened/irritated stop using the CHG.  Do not shave (including legs and underarms) for at least 48 hours prior to first CHG shower. It is OK to shave your face.  Please follow these instructions carefully.     Shower the NIGHT BEFORE SURGERY and the MORNING OF SURGERY with CHG Soap.   If you chose to wash your hair, wash your hair first as usual with your normal shampoo. After you shampoo, rinse your hair and body thoroughly to remove the shampoo.  Then [employer] and genitals (private parts) with your normal soap and rinse thoroughly to remove soap.  After that Use CHG Soap as you would any other liquid soap. You can apply CHG directly to the skin and wash gently with a scrungie or a clean washcloth.   Apply the CHG Soap to your body ONLY FROM THE NECK DOWN.  Do not use on open wounds or open sores. Avoid contact with your eyes, ears, mouth and genitals (private parts). Wash Face and genitals (private parts)  with your normal soap.   Wash thoroughly, paying special attention to the area where your surgery will be performed.  Thoroughly rinse your body with warm water from the neck down.  DO NOT shower/wash with your normal soap after using and rinsing off the CHG Soap.  Pat yourself dry with a CLEAN TOWEL.  Wear CLEAN PAJAMAS to bed the night before surgery  Place CLEAN SHEETS on your bed the night before your surgery  DO NOT SLEEP WITH PETS.   Day of Surgery:  Take a shower with CHG soap. Wear Clean/Comfortable clothing the morning of surgery Do not apply any deodorants/lotions.   Remember to brush your teeth WITH YOUR REGULAR TOOTHPASTE.   Please read over the following fact sheets that you were given.

## 2020-12-30 NOTE — Progress Notes (Signed)
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 Dx: Ental cell mass right breast. Case moved from [facility] following equivocal ETT done for chest pain evaluation, but she since had a low risk nuclear stress test on 12/16/2020.  Other history includes never smoker, GERD, hepatitis (A? 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  She had recent cardiac testing (see CV below). No additional testing planned following low risk Myoview study 12/16/20.   Reportedly on naltrexone for connective tissue disorder, last dose reported as 12/29/20 PM. Cardiology notes indicate that she primarily lives in [place] but also has a house in [place], [place].  [place] 12/30/20 2:30 PM.  Preoperative labs acceptable for OR.  Anesthesia team to evaluate on the day of surgery.   VS: BP 124/64    Pulse 84    Temp 36.6 C (Oral)    Resp 17    Ht 5\' 5"  (1.651 m)    Wt 60.6 kg    SpO2 99%    BMI 22.23 kg/m    PROVIDERS: Briles, Jodelle Green, NP is PCP  Rozann Lesches, MD is cardiologist   LABS: Labs reviewed: Acceptable for surgery. (all labs ordered are listed, but only abnormal results are displayed)  Labs Reviewed  CBC - Abnormal; Notable for the following components:      Result Value   MCV 102.0 (*)    All other components within normal limits  BASIC METABOLIC PANEL    EKG: 22/02/5425: Normal sinus rhythm Left anterior fascicular block   CV: Nuclear stress test 12/16/20 (done for equivocal ETT):   The study is normal. Findings are consistent with no ischemia.  The study is low risk.   0.5 mm of up sloping ST depression (II, III and aVF) was noted. The ECG was negative for ischemia by standard criteria.   LV perfusion is normal.  Mild breast attenuation mainly affecting rest images, no significant perfusion defects to indicate scar or ischemia.   Left ventricular function is normal. Nuclear stress EF: 74 %.   Low risk study, equivocal ST segment changes on GXT were nondiagnostic and perfusion imaging is normal without clear evidence of ischemia and LVEF 74%. - Reviewed by Dr. Domenic Polite, "Results reviewed.  Please let her know that the follow-up exercise Myoview was reassuring, the perfusion imaging was normal and does not suggest obstructive CAD as cause of her symptoms, also normal LVEF at 74%.  Would not anticipate further cardiac testing at this time."   ETT 12/13/20:   Patient exercise 6:68min on BRUCE protocol achieving 7.0 METs   Resting HR was 73bpm that rose to a maximum of 164bpm which is 111% of max predicted HR   There were up-sloping ST depression in the inferior leads (II, III and aVF) that resolved <50min into recovery   Equivocal study due to presence of upsloping ST depressions in the inferior leads. Recommend correlation with imaging study.   Echo 12/13/20: IMPRESSIONS   1. Left ventricular ejection fraction, by estimation, is 60 to 65%. The  left ventricle has normal function. The left ventricle has no regional  wall motion  abnormalities. Left ventricular diastolic parameters are  consistent with Grade I diastolic  dysfunction (impaired relaxation).   2. Right ventricular systolic function is normal. The right ventricular  size is normal. There is normal pulmonary artery systolic pressure. The  estimated right ventricular systolic pressure is 26.8 mmHg.   3. The mitral valve is normal in structure. Trivial mitral valve  regurgitation. No evidence of mitral stenosis.   4. The aortic valve is tricuspid. There is mild thickening of  the aortic  valve. Aortic valve regurgitation is not visualized. Aortic valve  sclerosis is present, with no evidence of aortic valve stenosis.   5. The inferior vena cava is normal in size with greater than 50%  respiratory variability, suggesting right atrial pressure of 3 mmHg.  Comparison(s): No prior Echocardiogram.    Past Medical History:  Diagnosis Date   Arthritis    Breast mass, right    GERD (gastroesophageal reflux disease)    H/O hiatal hernia    History of hepatitis    As a teenager - presumably hepatitis A   Hypothyroidism    Mass of appendix    Status post partial colectomy - low grade mucinous neoplasm   Menopausal state    Mixed connective tissue disease ([place])    Osteopenia     Past Surgical History:  Procedure Laterality Date   COLONOSCOPY     COLONOSCOPY N/A 07/25/2018   Procedure: COLONOSCOPY;  Surgeon: Rogene Houston, MD;  Location[facility];  Service: Endoscopy;  Laterality: N/A;  8:30   LAPAROSCOPIC PARTIAL COLECTOMY  01/26/2012   Procedure: LAPAROSCOPIC PARTIAL COLECTOMY;  Surgeon: Pedro Earls, MD;  Location: [facility] ORS;  Service: General;  Laterality: N/A;  Lap Assisted Partial Colectomy   LAPAROSCOPY  01/27/2012   Procedure: LAPAROSCOPY DIAGNOSTIC;  Surgeon: Pedro Earls, MD;  Location: [facility] ORS;  Service: General;  Laterality: N/A;   UPPER GI ENDOSCOPY  01/27/2012   Procedure: UPPER GI ENDOSCOPY;  Surgeon: Pedro Earls, MD;  Location: [facility];  Service: General;  Laterality: N/A;    MEDICATIONS:  Barberry-Oreg Grape-Goldenseal (BERBERINE COMPLEX PO)   Coenzyme Q10 (COQ-10 PO)   finasteride (PROSCAR) 5 MG tablet   MAGNESIUM-POTASSIUM PO   NALTREXONE HCL PO   OVER THE COUNTER MEDICATION   OVER THE COUNTER MEDICATION   OVER THE COUNTER MEDICATION   OVER THE COUNTER MEDICATION   OVER THE COUNTER MEDICATION   thyroid (ARMOUR) 30 MG tablet   Vitamin D-Vitamin K (VITAMIN K2-VITAMIN D3 PO)   No current facility-administered medications  for this encounter.   Myra Gianotti, PA-C Surgical Short Stay/Anesthesiology [facility] Phone [phone] [facility] Phone [phone] 12/30/2020 1:46 PM

## 2020-12-30 NOTE — Anesthesia Preprocedure Evaluation (Addendum)
Anesthesia Evaluation  Patient identified by MRN, date of birth, ID band Patient awake  General Assessment Comment:SPINDLE CELL MASS RIGHT BREAST  Reviewed: Allergy & Precautions, NPO status , Patient's Chart, lab work & pertinent test results  History of Anesthesia Complications Negative for: history of anesthetic complications  Airway Mallampati: I  TM Distance: >3 FB Neck ROM: Full    Dental  (+) Teeth Intact, Dental Advisory Given, Chipped, Caps,    Pulmonary neg pulmonary ROS,    Pulmonary exam normal breath sounds clear to auscultation       Cardiovascular Exercise Tolerance: Good Normal cardiovascular exam Rhythm:Regular Rate:Normal  Nuclear stress test 12/16/20 (done for equivocal ETT):  The study is normal. Findings are consistent with no ischemia. The study is low risk.  0.5 mm of up sloping ST depression (II, III and aVF) was noted. The ECG was negative for ischemia by standard criteria.  LV perfusion is normal. Mild breast attenuation mainly affecting rest images, no significant perfusion defects to indicate scar or ischemia.  Left ventricular function is normal. Nuclear stress EF: 74 %.  Low risk study, equivocal ST segment changes on GXT were nondiagnostic and perfusion imaging is normal without clear evidence of ischemia and LVEF 74%.  Echo 12/13/20: IMPRESSIONS   1. Left ventricular ejection fraction, by estimation, is 60 to 65%. The  left ventricle has normal function. The left ventricle has no regional  wall motion abnormalities. Left ventricular diastolic parameters are  consistent with Grade I diastolic  dysfunction (impaired relaxation).  2. Right ventricular systolic function is normal. The right ventricular  size is normal. There is normal pulmonary artery systolic pressure. The  estimated right ventricular systolic pressure is 24.4 mmHg.  3. The mitral valve is normal in structure. Trivial  mitral valve  regurgitation. No evidence of mitral stenosis.  4. The aortic valve is tricuspid. There is mild thickening of the aortic  valve. Aortic valve regurgitation is not visualized. Aortic valve  sclerosis is present, with no evidence of aortic valve stenosis.  5. The inferior vena cava is normal in size with greater than 50%  respiratory variability, suggesting right atrial pressure of 3 mmHg.  Comparison(s): No prior Echocardiogram.    Neuro/Psych negative neurological ROS  negative psych ROS   GI/Hepatic Neg liver ROS, hiatal hernia, GERD  Medicated,  Endo/Other  Hypothyroidism   Renal/GU negative Renal ROS     Musculoskeletal  (+) Arthritis ,   Abdominal   Peds  Hematology negative hematology ROS (+)   Anesthesia Other Findings Day of surgery medications reviewed with the patient.  Reproductive/Obstetrics                           Anesthesia Physical Anesthesia Plan  ASA: 2  Anesthesia Plan: General   Post-op Pain Management: Tylenol PO (pre-op)   Induction: Intravenous  PONV Risk Score and Plan: 3 and Dexamethasone, Ondansetron and Treatment may vary due to age or medical condition  Airway Management Planned: LMA  Additional Equipment:   Intra-op Plan:   Post-operative Plan: Extubation in OR  Informed Consent: I have reviewed the patients History and Physical, chart, labs and discussed the procedure including the risks, benefits and alternatives for the proposed anesthesia with the patient or authorized representative who has indicated his/her understanding and acceptance.     Dental advisory given  Plan Discussed with: CRNA  Anesthesia Plan Comments: (PAT note written 12/30/2020 by Myra Gianotti, PA-C. )  Anesthesia Quick Evaluation

## 2020-12-30 NOTE — Progress Notes (Signed)
PCP: Eddie North NP Cardiologist: Dr. Jannet Askew for cardiac testing, instructed to only f/u PRN  EKG: 10/28/20 CXR: n/a ECHO: 12/13/20 Stress Test: 12/16/20 Cardiac Cath: denies  ERAS: Clears until 9am  Covid TEST: N/A--ambulatory surgery  Pt takes Naltrexone for connective tissue disorder, will not take tonight's dose.  Patient denies shortness of breath, fever, cough, and chest pain at PAT appointment.  Patient verbalized understanding of instructions provided today at the PAT appointment.  Patient asked to review instructions at home and day of surgery.   Anesthesia Review: Toni Arthurs made aware surgery is for tomorrow, seed placement is for today at 2:30

## 2020-12-31 ENCOUNTER — Encounter (HOSPITAL_COMMUNITY)
Admission: RE | Disposition: A | Discharge status: Home / Self Care | Payer: Self-pay | Source: Home / Self Care | Attending: Surgery

## 2020-12-31 ENCOUNTER — Encounter (HOSPITAL_COMMUNITY): Payer: Self-pay | Admitting: Surgery

## 2020-12-31 ENCOUNTER — Ambulatory Visit (HOSPITAL_COMMUNITY): Payer: Medicare Other | Admitting: General Practice

## 2020-12-31 ENCOUNTER — Ambulatory Visit (HOSPITAL_COMMUNITY)
Admission: RE | Admit: 2020-12-31 | Discharge: 2020-12-31 | Disposition: A | Discharge status: Home / Self Care | Payer: Medicare Other | Attending: Surgery | Admitting: Surgery

## 2020-12-31 ENCOUNTER — Ambulatory Visit
Admission: RE | Admit: 2020-12-31 | Discharge: 2020-12-31 | Disposition: A | Discharge status: Home / Self Care | Payer: Medicare Other | Source: Ambulatory Visit | Attending: Surgery | Admitting: Surgery

## 2020-12-31 ENCOUNTER — Ambulatory Visit (HOSPITAL_COMMUNITY): Payer: Medicare Other | Admitting: Vascular Surgery

## 2020-12-31 ENCOUNTER — Other Ambulatory Visit: Payer: Self-pay

## 2020-12-31 DIAGNOSIS — D241 Benign neoplasm of right breast: Secondary | ICD-10-CM | POA: Insufficient documentation

## 2020-12-31 DIAGNOSIS — N6310 Unspecified lump in the right breast, unspecified quadrant: Secondary | ICD-10-CM

## 2020-12-31 HISTORY — PX: RADIOACTIVE SEED GUIDED EXCISIONAL BREAST BIOPSY: SHX6490

## 2020-12-31 SURGERY — RADIOACTIVE SEED GUIDED BREAST BIOPSY
Anesthesia: General | Site: Breast | Laterality: Right

## 2020-12-31 MED ORDER — ONDANSETRON HCL 4 MG/2ML IJ SOLN
INTRAMUSCULAR | Status: DC | PRN
  Administered 2020-12-31: 4 mg via INTRAVENOUS

## 2020-12-31 MED ORDER — FENTANYL CITRATE (PF) 250 MCG/5ML IJ SOLN
INTRAMUSCULAR | Status: AC
  Filled 2020-12-31: qty 5

## 2020-12-31 MED ORDER — CHLORHEXIDINE GLUCONATE CLOTH 2 % EX PADS: 6.0000 | MEDICATED_PAD | Freq: Once | CUTANEOUS | Status: DC

## 2020-12-31 MED ORDER — HYDROCODONE-ACETAMINOPHEN 5-325 MG PO TABS
1.0000 | ORAL_TABLET | Freq: Once | ORAL | Status: AC
  Administered 2020-12-31: 1 via ORAL

## 2020-12-31 MED ORDER — HYDROCODONE-ACETAMINOPHEN 5-325 MG PO TABS
ORAL_TABLET | ORAL | Status: AC
  Filled 2020-12-31: qty 1

## 2020-12-31 MED ORDER — BUPIVACAINE HCL (PF) 0.25 % IJ SOLN
INTRAMUSCULAR | Status: AC
  Filled 2020-12-31: qty 30

## 2020-12-31 MED ORDER — LACTATED RINGERS IV SOLN: INTRAVENOUS | Status: DC

## 2020-12-31 MED ORDER — LIDOCAINE 2% (20 MG/ML) 5 ML SYRINGE
INTRAMUSCULAR | Status: DC | PRN
  Administered 2020-12-31: 60 mg via INTRAVENOUS

## 2020-12-31 MED ORDER — LIDOCAINE 2% (20 MG/ML) 5 ML SYRINGE
INTRAMUSCULAR | Status: AC
  Filled 2020-12-31: qty 5

## 2020-12-31 MED ORDER — 0.9 % SODIUM CHLORIDE (POUR BTL) OPTIME
TOPICAL | Status: DC | PRN
  Administered 2020-12-31: 1000 mL

## 2020-12-31 MED ORDER — PROPOFOL 10 MG/ML IV BOLUS
INTRAVENOUS | Status: DC | PRN
  Administered 2020-12-31: 130 mg via INTRAVENOUS

## 2020-12-31 MED ORDER — FENTANYL CITRATE (PF) 250 MCG/5ML IJ SOLN
INTRAMUSCULAR | Status: DC | PRN
  Administered 2020-12-31: 25 ug via INTRAVENOUS
  Administered 2020-12-31: 50 ug via INTRAVENOUS

## 2020-12-31 MED ORDER — DEXAMETHASONE SODIUM PHOSPHATE 10 MG/ML IJ SOLN
INTRAMUSCULAR | Status: DC | PRN
  Administered 2020-12-31: 10 mg via INTRAVENOUS

## 2020-12-31 MED ORDER — CHLORHEXIDINE GLUCONATE 0.12 % MT SOLN
15.0000 mL | Freq: Once | OROMUCOSAL | Status: AC
  Administered 2020-12-31: 15 mL via OROMUCOSAL

## 2020-12-31 MED ORDER — PHENYLEPHRINE 40 MCG/ML (10ML) SYRINGE FOR IV PUSH (FOR BLOOD PRESSURE SUPPORT)
PREFILLED_SYRINGE | INTRAVENOUS | Status: DC | PRN
  Administered 2020-12-31 (×2): 40 ug via INTRAVENOUS

## 2020-12-31 MED ORDER — CEFAZOLIN SODIUM-DEXTROSE 2-4 GM/100ML-% IV SOLN
2.0000 g | INTRAVENOUS | Status: AC
  Administered 2020-12-31: 2 g via INTRAVENOUS

## 2020-12-31 MED ORDER — ACETAMINOPHEN 500 MG PO TABS
ORAL_TABLET | ORAL | Status: AC
  Administered 2020-12-31: 1000 mg via ORAL
  Filled 2020-12-31: qty 2

## 2020-12-31 MED ORDER — EPHEDRINE SULFATE-NACL 50-0.9 MG/10ML-% IV SOSY
PREFILLED_SYRINGE | INTRAVENOUS | Status: DC | PRN
  Administered 2020-12-31 (×2): 2.5 mg via INTRAVENOUS

## 2020-12-31 MED ORDER — HYDROCODONE-ACETAMINOPHEN 5-325 MG PO TABS: 1.0000 | ORAL_TABLET | Freq: Four times a day (QID) | ORAL | 0 refills | Status: DC | PRN

## 2020-12-31 MED ORDER — PHENYLEPHRINE 40 MCG/ML (10ML) SYRINGE FOR IV PUSH (FOR BLOOD PRESSURE SUPPORT)
PREFILLED_SYRINGE | INTRAVENOUS | Status: AC
  Filled 2020-12-31: qty 10

## 2020-12-31 MED ORDER — ONDANSETRON HCL 4 MG/2ML IJ SOLN: 4.0000 mg | Freq: Once | INTRAMUSCULAR | Status: DC | PRN

## 2020-12-31 MED ORDER — ACETAMINOPHEN 500 MG PO TABS: 1000.0000 mg | ORAL_TABLET | ORAL | Status: AC

## 2020-12-31 MED ORDER — SCOPOLAMINE 1 MG/3DAYS TD PT72: 1.0000 | MEDICATED_PATCH | TRANSDERMAL | Status: DC

## 2020-12-31 MED ORDER — ONDANSETRON HCL 4 MG/2ML IJ SOLN
INTRAMUSCULAR | Status: AC
  Filled 2020-12-31: qty 2

## 2020-12-31 MED ORDER — BUPIVACAINE HCL (PF) 0.25 % IJ SOLN
INTRAMUSCULAR | Status: DC | PRN
  Administered 2020-12-31: 15 mL

## 2020-12-31 MED ORDER — CEFAZOLIN SODIUM-DEXTROSE 2-4 GM/100ML-% IV SOLN
INTRAVENOUS | Status: AC
  Filled 2020-12-31: qty 100

## 2020-12-31 MED ORDER — EPHEDRINE 5 MG/ML INJ
INTRAVENOUS | Status: AC
  Filled 2020-12-31: qty 5

## 2020-12-31 MED ORDER — DEXAMETHASONE SODIUM PHOSPHATE 10 MG/ML IJ SOLN
INTRAMUSCULAR | Status: AC
  Filled 2020-12-31: qty 1

## 2020-12-31 MED ORDER — SCOPOLAMINE 1 MG/3DAYS TD PT72
MEDICATED_PATCH | TRANSDERMAL | Status: AC
  Administered 2020-12-31: 1.5 mg via TRANSDERMAL
  Filled 2020-12-31: qty 1

## 2020-12-31 MED ORDER — FENTANYL CITRATE (PF) 100 MCG/2ML IJ SOLN: 25.0000 ug | INTRAMUSCULAR | Status: DC | PRN

## 2020-12-31 SURGICAL SUPPLY — 40 items
BAG COUNTER SPONGE SURGICOUNT (BAG) ×2 IMPLANT
BAG SURGICOUNT SPONGE COUNTING (BAG) ×1
BENZOIN TINCTURE PRP APPL 2/3 (GAUZE/BANDAGES/DRESSINGS) IMPLANT
BINDER BREAST LRG (GAUZE/BANDAGES/DRESSINGS) ×2 IMPLANT
BINDER BREAST XLRG (GAUZE/BANDAGES/DRESSINGS) IMPLANT
BNDG COHESIVE 4X5 WHT NS (GAUZE/BANDAGES/DRESSINGS) ×3 IMPLANT
CANISTER SUCT 3000ML PPV (MISCELLANEOUS) ×3 IMPLANT
CLEANER TIP ELECTROSURG 2X2 (MISCELLANEOUS) ×3 IMPLANT
CLOSURE WOUND 1/4X4 (GAUZE/BANDAGES/DRESSINGS)
CNTNR URN SCR LID CUP LEK RST (MISCELLANEOUS) IMPLANT
CONT SPEC 4OZ STRL OR WHT (MISCELLANEOUS)
COVER SURGICAL LIGHT HANDLE (MISCELLANEOUS) ×4 IMPLANT
DECANTER SPIKE VIAL GLASS SM (MISCELLANEOUS) ×9 IMPLANT
DERMABOND ADVANCED (GAUZE/BANDAGES/DRESSINGS) ×2
DERMABOND ADVANCED .7 DNX12 (GAUZE/BANDAGES/DRESSINGS) IMPLANT
DEVICE DUBIN SPECIMEN MAMMOGRA (MISCELLANEOUS) IMPLANT
DRAPE LAPAROTOMY TRNSV 102X78 (DRAPES) ×3 IMPLANT
DRSG PAD ABDOMINAL 8X10 ST (GAUZE/BANDAGES/DRESSINGS) ×2 IMPLANT
ELECT REM PT RETURN 9FT ADLT (ELECTROSURGICAL) ×3
ELECTRODE REM PT RTRN 9FT ADLT (ELECTROSURGICAL) ×1 IMPLANT
GAUZE 4X4 16PLY ~~LOC~~+RFID DBL (SPONGE) ×6 IMPLANT
GAUZE SPONGE 4X4 12PLY STRL (GAUZE/BANDAGES/DRESSINGS) ×3 IMPLANT
GLOVE SURG ENC MOIS LTX SZ8 (GLOVE) ×3 IMPLANT
GOWN STRL REUS W/ TWL LRG LVL3 (GOWN DISPOSABLE) ×1 IMPLANT
GOWN STRL REUS W/TWL 2XL LVL3 (GOWN DISPOSABLE) ×3 IMPLANT
GOWN STRL REUS W/TWL LRG LVL3 (GOWN DISPOSABLE) ×2
KIT BASIN OR (CUSTOM PROCEDURE TRAY) ×3 IMPLANT
KIT MARKER MARGIN INK (KITS) IMPLANT
KIT TURNOVER KIT B (KITS) ×3 IMPLANT
NS IRRIG 1000ML POUR BTL (IV SOLUTION) ×3 IMPLANT
PACK GENERAL/GYN (CUSTOM PROCEDURE TRAY) ×3 IMPLANT
PAD ARMBOARD 7.5X6 YLW CONV (MISCELLANEOUS) ×6 IMPLANT
PENCIL SMOKE EVACUATOR (MISCELLANEOUS) ×3 IMPLANT
SPONGE T-LAP 4X18 ~~LOC~~+RFID (SPONGE) ×4 IMPLANT
STRIP CLOSURE SKIN 1/4X4 (GAUZE/BANDAGES/DRESSINGS) IMPLANT
SUT CHROMIC 4 0 P 3 18 (SUTURE) ×2 IMPLANT
SUT VIC AB 3-0 SH 8-18 (SUTURE) ×2 IMPLANT
SYR CONTROL 10ML LL (SYRINGE) ×2 IMPLANT
TOWEL GREEN STERILE (TOWEL DISPOSABLE) ×3 IMPLANT
TOWEL GREEN STERILE FF (TOWEL DISPOSABLE) ×3 IMPLANT

## 2020-12-31 NOTE — Op Note (Signed)
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  After anesthesia was administered and the patient was prepped  with chloroprep  and a timeout was performed.  The right breast area was mapped with the hand held neoprobe.  The hot spot at 1 oclock was mapped and a curvilinear incision was described on the skin and made with the knife.  Lateral and medial flaps were made with the Bovie and I could palpate a mass and I let that guide my dissection and removal.  The mass was taken from the pectoralis muscle posterior.  The seed seemed to be in the middle of the resultant specimen.  The specimen was removed and carefully marked with paint and xrayed revealing the seed and the marker in the middle.  The margins of the wound and were infiltrated with lidocaine and closed with 4-0 Vicryl and 4-0 Monocryl with Dermabond on the skin.  Breast binder was applied.  The patient tolerated the procedure well and was taken to the PACU in stable condition.     Matt B. Hassell Done, [place], [facility], [place]

## 2020-12-31 NOTE — Transfer of Care (Signed)
Immediate Anesthesia Transfer of Care Note  Patient: Pamela Morrow  Procedure(s) Performed: RADIOACTIVE SEED GUIDED EXCISIONAL RIGHT BREAST BIOPSY (Right: Breast)  Patient Location: PACU  Anesthesia Type:General  Level of Consciousness: awake, drowsy and patient cooperative  Airway & Oxygen Therapy: Patient Spontanous Breathing  Post-op Assessment: Report given to RN and Post -op Vital signs reviewed and stable  Post vital signs: Reviewed and stable  Last Vitals:  Vitals Value Taken Time  BP 131/79 12/31/20 1336  Temp    Pulse 71 12/31/20 1337  Resp 18 12/31/20 1337  SpO2 99 % 12/31/20 1337  Vitals shown include unvalidated device data.  Last Pain:  Vitals:   12/31/20 1050  TempSrc: Oral  PainSc:          Complications: No notable events documented.

## 2020-12-31 NOTE — Interval H&P Note (Signed)
History and Physical Interval Note:  12/31/2020 11:54 AM  Pamela Morrow  has presented today for surgery, with the diagnosis of SPINDLE CELL MASS RIGHT BREAST.  The various methods of treatment have been discussed with the patient and family. After consideration of risks, benefits and other options for treatment, the patient has consented to  Procedure(s): RADIOACTIVE SEED GUIDED EXCISIONAL RIGHT BREAST BIOPSY (Right) as a surgical intervention.  The patient's history has been reviewed, patient examined, no change in status, stable for surgery.  I have reviewed the patient's chart and labs.  Questions were answered to the patient's satisfaction.     Pedro Earls

## 2020-12-31 NOTE — H&P (Signed)
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 female who is seen today as an office consultation at the request of Dr. Kirk Ruths for evaluation of New Consultation (Spindle cell proliferation) .  Mr and [name] came in to discuss her recent diagnosis of a mass in her right breast. I cared for her back in 2014 when I did an ileocecectomy for a low-grade mucinous neoplasm of her appendix. They actually live in [place] and her primary care doctor is in [facility] and she had a core biopsy that was read at [facility] that showed spindle cells. A marker was placed in this mass in the upper inner quadrant of her right breast. It was difficult to really find all of the information pieces since they were done in different health records but this appears to be a spindle cell neoplasm that has grown in size on mammography, it is not palpable and the biopsy showed spindle cells but no added no or precancerous material. However since it has grown biopsy was recommended. I have discussed seed localization and removal with her and her husband. Since they live between here in [place] they may schedule this over Thanksgiving Christmas time. I have placed orders in epic including for seed localization.  Review of Systems: See HPI as well for other ROS.  ROS   Medical History: Past Medical History:  Diagnosis Date   GERD (gastroesophageal reflux disease)   Thyroid disease   Patient Active Problem List  Diagnosis   Anemia   Appendiceal tumor   Change in stool   Gastric hemorrhage   GERD (gastroesophageal reflux disease)   Hypothyroidism   Past Surgical History:  Procedure Laterality Date   ROBOTIC COLECTOMY PARTIAL 01/17/2012    Allergies  Allergen Reactions   Codeine Hives and Nausea   Current Outpatient  Medications on File Prior to Visit  Medication Sig Dispense Refill   finasteride (PROSCAR) 5 mg tablet Take 5 mg by mouth once daily   naltrexone (REVIA) 50 mg tablet Take 4.5 mg by mouth once daily   thyroid (NP THYROID) 30 mg tablet Take by mouth   No current facility-administered medications on file prior to visit.   Family History  Problem Relation Age of Onset   Stroke Mother   High blood pressure (Hypertension) Mother    Social History   Tobacco Use  Smoking Status Never Smoker  Smokeless Tobacco Never Used    Social History   Socioeconomic History   Marital status: Married  Tobacco Use   Smoking status: Never Smoker   Smokeless tobacco: Never Used  Substance and Sexual Activity   Alcohol use: Never   Drug use: Never   Objective:   BP: 118/78  Pulse: 88  SpO2: 94%  Weight: 71.1 kg (156 lb 12.8 oz)  Height: 165.1 cm (5\' 5" )   Body mass index is 26.09 kg/m.  Physical Exam HEENT-normal Chest-clear Heart -SR no murmurs General: Well-developed well-nourished white female. Breast: The bruising that she had from record has been resorbed. Her core track came in from laterally to medially but the area is nonpalpable. Ext FROM Labs, Imaging and Diagnostic Testing: Mammograms at [facility] yesterday reviewed  Assessment and Plan:  Mass in the right breast has been biopsied and spindle cells were seen.  Mass of upper inner quadrant of right breast -  CCS Case Posting Request; Future   radioactive seed localization of mass in her right breast under general anesthesia at [facility] day surgery. Orders have been placed in epic.    Lashanti Chambless Donia Pounds, MD

## 2020-12-31 NOTE — Anesthesia Procedure Notes (Signed)
Procedure Name: LMA Insertion Date/Time: 12/31/2020 12:23 PM Performed by: Ardyth Harps, CRNA Pre-anesthesia Checklist: Patient identified, Emergency Drugs available, Suction available and Patient being monitored Patient Re-evaluated:Patient Re-evaluated prior to induction Oxygen Delivery Method: Circle System Utilized Preoxygenation: Pre-oxygenation with 100% oxygen Induction Type: IV induction Ventilation: Mask ventilation without difficulty LMA: LMA inserted LMA Size: 4.0 Number of attempts: 1 Airway Equipment and Method: Bite block Placement Confirmation: positive ETCO2 Tube secured with: Tape Dental Injury: Teeth and Oropharynx as per pre-operative assessment

## 2021-01-02 NOTE — Anesthesia Postprocedure Evaluation (Signed)
Anesthesia Post Note  Patient: Pamela Morrow  Procedure(s) Performed: RADIOACTIVE SEED GUIDED EXCISIONAL RIGHT BREAST BIOPSY (Right: Breast)     Patient location during evaluation: PACU Anesthesia Type: General Level of consciousness: awake and alert Pain management: pain level controlled Vital Signs Assessment: post-procedure vital signs reviewed and stable Respiratory status: spontaneous breathing, nonlabored ventilation and respiratory function stable Cardiovascular status: blood pressure returned to baseline and stable Postop Assessment: no apparent nausea or vomiting Anesthetic complications: no   No notable events documented.  Last Vitals:  Vitals:   12/31/20 1351 12/31/20 1404  BP: 126/67 136/76  Pulse: 65 61  Resp: 17 19  Temp:  36.5 C  SpO2: 100% 99%    Last Pain:  Vitals:   12/31/20 1404  TempSrc:   PainSc: 2                  Catalina Gravel

## 2021-01-03 ENCOUNTER — Encounter (HOSPITAL_COMMUNITY): Payer: Self-pay | Admitting: Surgery

## 2021-01-03 LAB — SURGICAL PATHOLOGY

## 2021-03-23 ENCOUNTER — Encounter (HOSPITAL_COMMUNITY): Payer: Self-pay

## 2021-10-19 ENCOUNTER — Encounter: Payer: Self-pay | Admitting: Gastroenterology

## 2021-10-19 ENCOUNTER — Ambulatory Visit (INDEPENDENT_AMBULATORY_CARE_PROVIDER_SITE_OTHER): Payer: Medicare Other | Admitting: Gastroenterology

## 2021-10-19 VITALS — BP 118/74 | HR 72 | Temp 97.2°F | Ht 65.0 in | Wt 147.4 lb

## 2021-10-19 DIAGNOSIS — K644 Residual hemorrhoidal skin tags: Secondary | ICD-10-CM | POA: Diagnosis not present

## 2021-10-19 NOTE — Progress Notes (Signed)
GI Office Note    Referring Provider: Bryson Corona, NP Primary Care Physician:  Bryson Corona, NP  Primary Gastroenterologist: Dr. Gala Romney  (previously Dr. Laural Golden)  Chief Complaint   Chief Complaint  Patient presents with   Hemorrhoids    Pt complains of hemorrhoids bothering her since the 2nd week of July    History of Present Illness   Pamela Morrow is a 76 y.o. female presenting today at the request of Briles, Tosha M, NP for hemorrhoids.   EGD and colonoscopy September 2013.  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  Biopsies positive for H. pylori.  No evidence of esophageal or gastric varices.  Colonoscopy with 3 tubular adenoma in the cecum, 12 mm submucosal lesion of the cecum.   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  She had 16 lymph nodes are negative for neoplasm.  Last office visit in July 2020 patient had a change in her bowel habits, having more trouble having a bowel movement.  For 3 weeks she noticed not going as often, noting thinner stools.  Also having decreased volume of stools.  Denied any weight loss.  Reported good appetite and denied any melena or BRBPR.  Colonoscopy July 2020: Patent end-to-side ileocolonic anastomosis with healthy-appearing mucosa.  External hemorrhoids noted.  Advised to begin Benefiber 4 g nightly and continue high-fiber diet.   Today: Had hemorrhoids once before in the past and used preparation H or suppositories and it would go away in 48 hours or so.  Seems like one hemorrhoid that does not want to go away. At first it felt like stools were little knives when passing. A couple weeks ago the pain is better but right now she is still very tender although still able to sit comfortably.  She notes that the hemorrhoid is still outside. Has not tried to push her  hemorrhoid in, therefore she is unsure if this is a protruding internal hemorrhoid or if it is an external hemorrhoid. When using her preparation H suppository it doesn't seem to want to go in but has not tried. Has been 1 week without bleeding. She was previously only having toilet tissue hematochezia or little bits of dripping blood. Still having some itching and the discomfort feels like being poked with a needle. Does not seem to be straining to use the bathroom. Typically has at least 1, sometimes 2 BM per day. Stools are not really hard but not super soft either. Not currently taking anything for constipation, has been eating fiber. Spends about 5-10 minutes a time on the commode to relax and allow her stool to pass easily without straining. In July she talked to her PCP and she was given anucort and used that without significant improvement.  She was advised to the Anucort did not improve her symptoms, and that she should follow-up with GI.  Patient reports that with her last colonoscopy, she was stated that she did not need to repeat given her age.   Current Outpatient Medications  Medication Sig Dispense Refill   Barberry-Oreg Grape-Goldenseal (BERBERINE COMPLEX PO) Take 1 tablet by mouth 2 (two) times daily.     Coenzyme Q10 (COQ-10 PO) Take 100 mg by mouth daily. Liquid     estradiol (VIVELLE-DOT) 0.075 MG/24HR Place 1 patch onto the skin 2 (two) times a week.     finasteride (  PROSCAR) 5 MG tablet Take 5 mg by mouth every evening.     MAGNESIUM-POTASSIUM PO Take 1 tablet by mouth daily as needed (cramping / restless).     NALTREXONE HCL PO Take 4.5 mg by mouth at bedtime. Compounded medication by [facility]     progesterone (PROMETRIUM) 100 MG capsule Take 100 mg by mouth daily.     thyroid (ARMOUR) 30 MG tablet Take 30 mg by mouth daily before breakfast.     Vitamin D-Vitamin K (VITAMIN K2-VITAMIN D3 PO) Take 1 tablet by mouth every other day.     HYDROcodone-acetaminophen (NORCO/VICODIN)  5-325 MG tablet Take 1 tablet by mouth every 6 (six) hours as needed for moderate pain. (Patient not taking: Reported on 10/19/2021) 15 tablet 0   OVER THE COUNTER MEDICATION Take 1 tablet by mouth daily. Proflora 4R (Patient not taking: Reported on 10/19/2021)     OVER THE COUNTER MEDICATION Take 2 capsules by mouth 2 (two) times daily. Biocidin otc supplement (Patient not taking: Reported on 10/19/2021)     OVER THE COUNTER MEDICATION Take 1 capsule by mouth daily. GI detox otc supplement (Patient not taking: Reported on 10/19/2021)     OVER THE COUNTER MEDICATION Take 1 capsule by mouth 3 (three) times a week. Iodoral (iodine/potassium iodide supplement) (Patient not taking: Reported on 10/19/2021)     OVER THE COUNTER MEDICATION Take 1 tablet by mouth daily. NAC Detox (Patient not taking: Reported on 10/19/2021)     No current facility-administered medications for this visit.    Past Medical History:  Diagnosis Date   Arthritis    Breast mass, right    GERD (gastroesophageal reflux disease)    H/O hiatal hernia    History of hepatitis    As a teenager - presumably hepatitis A   Hypothyroidism    Mass of appendix    Status post partial colectomy - low grade mucinous neoplasm   Menopausal state    Mixed connective tissue disease ([place])    Osteopenia     Past Surgical History:  Procedure Laterality Date   COLONOSCOPY     COLONOSCOPY N/A 07/25/2018   Procedure: COLONOSCOPY;  Surgeon: Rogene Houston, MD;  [facility];  Service: Endoscopy;  Laterality: N/A;  8:30   LAPAROSCOPIC PARTIAL COLECTOMY  01/26/2012   Procedure: LAPAROSCOPIC PARTIAL COLECTOMY;  Surgeon: Pedro Earls, MD;  Location: [facility];  Service: General;  Laterality: N/A;  Lap Assisted Partial Colectomy   LAPAROSCOPY  01/27/2012   Procedure: LAPAROSCOPY DIAGNOSTIC;  Surgeon: Pedro Earls, MD;  Location: [facility];  Service: General;  Laterality: N/A;   RADIOACTIVE SEED GUIDED EXCISIONAL BREAST BIOPSY Right  12/31/2020   Procedure: RADIOACTIVE SEED GUIDED EXCISIONAL RIGHT BREAST BIOPSY;  Surgeon: Johnathan Hausen, MD;  Location: [place];  Service: General;  Laterality: Right;   UPPER GI ENDOSCOPY  01/27/2012   Procedure: UPPER GI ENDOSCOPY;  Surgeon: Pedro Earls, MD;  [facility];  Service: General;  Laterality: N/A;    Family History  Problem Relation Age of Onset   Colitis Sister    Cancer Sister        cervial   Cancer Father        esophageal    Allergies as of 10/19/2021 - Review Complete 10/19/2021  Allergen Reaction Noted   Codeine Hives and Nausea Only 09/04/2011    Social History   Socioeconomic History   Marital status: Married    Spouse name: Not on file  Number of children: Not on file   Years of education: Not on file   Highest education level: Not on file  Occupational History   Not on file  Tobacco Use   Smoking status: Never   Smokeless tobacco: Never  Substance and Sexual Activity   Alcohol use: Yes    Comment: 2 drinks a month   Drug use: No   Sexual activity: Not on file  Other Topics Concern   Not on file  Social History Narrative   Not on file   Social Determinants of Health   Financial Resource Strain: Not on file  Food Insecurity: Not on file  Transportation Needs: Not on file  Physical Activity: Not on file  Stress: Not on file  Social Connections: Not on file  Intimate Partner Violence: Not on file     Review of Systems   Gen: Denies any fever, chills, fatigue, weight loss, lack of appetite.  CV: Denies chest pain, heart palpitations, peripheral edema, syncope.  Resp: Denies shortness of breath at rest or with exertion. Denies wheezing or cough.  GI: see HPI GU : Denies urinary burning, urinary frequency, urinary hesitancy MS: Denies joint pain, muscle weakness, cramps, or limitation of movement.  Derm: Denies rash, itching, dry skin Psych: Denies depression, anxiety, memory loss, and confusion Heme: Denies bruising,  bleeding, and enlarged lymph nodes.   Physical Exam   BP 118/74   Pulse 72   Temp (!) 97.2 F (36.2 C)   Ht '5\' 5"'$  (1.651 m)   Wt 147 lb 6.4 oz (66.9 kg)   BMI 24.53 kg/m   General:   Alert and oriented. Pleasant and cooperative. Well-nourished and well-developed.  Head:  Normocephalic and atraumatic. Eyes:  Without icterus, sclera clear and conjunctiva pink.  Ears:  Normal auditory acuity. Mouth:  No deformity or lesions, oral mucosa pink.  Rectal:  good tone. Small right external hemorrhoid, non thrombosed. Tenderness of exam. No internal hemorrhoids noted.  Neurologic:  Alert and  oriented x4;  grossly normal neurologically. Skin:  Intact without significant lesions or rashes. Psych:  Alert and cooperative. Normal mood and affect.   Assessment   Pamela Morrow is a 76 y.o. female with a history of arthritis, GERD, hypothyroidism, appendiceal mass s/p partial colectomy, and colon polyps presenting today with complaint of ongoing hemorrhoids with intermittent rectal bleeding.  External hemorrhoid: History of hemorrhoids in the past that she reports she was able to treat with Preparation H suppositories or other wipes and will have improvement within 48 hours.  She has had some associated bleeding as well as itching but symptoms have been going on since July.  She reports that 1 hemorrhoid does not seem to want to go away and was previously having significant discomfort when passing stools.  She reports right now she is still tender at times.  Has not noticed any bleeding within the last week.  Usually only having blood on the toilet tissue with very rare dripping.  Denies any frequent straining, typically has about at least 1 stool per day that is usually soft and semiformed.  Spending about 5-10 minutes on the commode at the time.  Has been self treating at home with Preparation H or Anucort prescribed by PCP.  She had good tone on rectal exam today with an apparent external  hemorrhoid.  She has had evidence of external hemorrhoids on past colonoscopies.  Hemorrhoid does not appear to be thrombosed at this time.  Hemorrhoid is not reducible  and she did have some tenderness on exam.  No internal hemorrhoid tissue felt on internal exam.  We discussed other conservative measures for treatment of hemorrhoid including frequent sitz bath's, and/or use of Tucks pads, and consideration for surgical excision.  She is open to conservative management at home and would like to defer referral for surgical management at this time.  Education was provided and she is to call with a progress report in 1-2 weeks.   PLAN   Warm sitz bath's 2-3 times daily for 1 week Use of Tucks pads as needed May use over-the-counter rectal lidocaine to help with discomfort Continue high-fiber diet, avoid straining, and limit toilet time. Fiber supplementation recommended. Call with progress report in 1-2 weeks. Office follow-up as needed, encouraged to call if she would like to consider referral to general surgery   Venetia Night, MSN, FNP-BC, AGACNP-BC [facility]

## 2021-10-19 NOTE — Patient Instructions (Signed)
Please avoid straining. You should limit your toilet time to 2-3 minutes at the most. Taking short breaks in between toilet time if needed.   I recommend Benefiber 2 teaspoons each morning in the beverage of your choice!  For the treatment of your external hemorrhoid, I recommend you complete sitz bath's 2-3 times daily for a week.  It may be helpful to have these done warm.  You may use ice in between.  You may also try Tucks pads for treatment.  Also over-the-counter rectal lidocaine can be helpful with any intermittent discomfort.  If you decide that you would like to discuss further treatment options for external hemorrhoids with general surgery I am happy to send in a referral.   It was a pleasure to meet you. Please let me know how you are doing in a week or 2. Call to schedule follow up if needed.   It was a pleasure to see you today. I want to create trusting relationships with patients. If you receive a survey regarding your visit,  I greatly appreciate you taking time to fill this out on paper or through your MyChart. I value your feedback.  Venetia Night, MSN, FNP-BC, AGACNP-BC [facility]

## 2021-12-01 ENCOUNTER — Ambulatory Visit (INDEPENDENT_AMBULATORY_CARE_PROVIDER_SITE_OTHER): Payer: Medicare Other | Admitting: Gastroenterology

## 2021-12-28 ENCOUNTER — Ambulatory Visit (INDEPENDENT_AMBULATORY_CARE_PROVIDER_SITE_OTHER): Payer: Medicare Other

## 2021-12-28 ENCOUNTER — Ambulatory Visit (INDEPENDENT_AMBULATORY_CARE_PROVIDER_SITE_OTHER): Payer: Medicare Other | Admitting: Orthopedic Surgery

## 2021-12-28 ENCOUNTER — Encounter: Payer: Self-pay | Admitting: Orthopedic Surgery

## 2021-12-28 VITALS — Ht 65.0 in | Wt 147.0 lb

## 2021-12-28 DIAGNOSIS — M25551 Pain in right hip: Secondary | ICD-10-CM

## 2021-12-28 DIAGNOSIS — M25552 Pain in left hip: Secondary | ICD-10-CM

## 2021-12-28 NOTE — Progress Notes (Signed)
New Patient Visit  Assessment: Pamela Morrow is a 76 y.o. female with the following: 1. Bilateral hip pain  Plan: Pamela Morrow has pain in both hips.  Pain starts in the lower back, radiates into her buttocks, and wraps around both hips.  She does have some radiating pains down the anterior aspect of her thighs.  This gets worse with increased activity.  On physical exam, she has good range of motion of her hip.  Negative straight leg raise bilaterally.  No numbness or tingling.  She has no point tenderness over the greater trochanters bilaterally.  Radiographs are without acute problems.  Minimal degenerative changes overall.  Provided reassurance.  Discussed potential causes of her discomfort.  I do think she would benefit from physical therapy.  She is interested in increasing her level of activity, with some strength training.  I think this is a reasonable idea.  She seemed to be pleased with the review of the radiographs.  Nothing further is needed at this time.  Follow-up: Return if symptoms worsen or fail to improve.  Subjective:  Chief Complaint  Patient presents with   Hip Pain    Bil hip pain with heavy lifting. Pt does a lot of walking and hurts when she crosses her legs    History of Present Illness: Pamela Morrow is a 76 y.o. female who presents for evaluation of bilateral hip pain.  She localizes the pain across the lower back, radiating into the lateral aspect of both hips, and into the anterior thighs.  This gets worse with activity.  If she does a lot of lifting, the symptoms worsen.  No pain in her groin.  She has difficulty laying on her side, but is not due to point tenderness over the lateral hip.  She is taking some natural supplements without improvement.  She states she tried alpha lipoic acid for a couple of days, but this worsened her symptoms.  She has not worked with physical therapy.  No prior injections.  No specific injury.   Review of Systems: No  fevers or chills No numbness or tingling No chest pain No shortness of breath No bowel or bladder dysfunction No GI distress No headaches   Medical History:  Past Medical History:  Diagnosis Date   Arthritis    Breast mass, right    GERD (gastroesophageal reflux disease)    H/O hiatal hernia    History of hepatitis    As a teenager - presumably hepatitis A   Hypothyroidism    Mass of appendix    Status post partial colectomy - low grade mucinous neoplasm   Menopausal state    Mixed connective tissue disease ([place])    Osteopenia     Past Surgical History:  Procedure Laterality Date   COLONOSCOPY     COLONOSCOPY N/A 07/25/2018   Procedure: COLONOSCOPY;  Surgeon: Rogene Houston, MD;  [facility];  Service: Endoscopy;  Laterality: N/A;  8:30   LAPAROSCOPIC PARTIAL COLECTOMY  01/26/2012   Procedure: LAPAROSCOPIC PARTIAL COLECTOMY;  Surgeon: Pedro Earls, MD;  [facility];  Service: General;  Laterality: N/A;  Lap Assisted Partial Colectomy   LAPAROSCOPY  01/27/2012   Procedure: LAPAROSCOPY DIAGNOSTIC;  Surgeon: Pedro Earls, MD;  [facility];  Service: General;  Laterality: N/A;   RADIOACTIVE SEED GUIDED EXCISIONAL BREAST BIOPSY Right 12/31/2020   Procedure: RADIOACTIVE SEED GUIDED EXCISIONAL RIGHT BREAST BIOPSY;  Surgeon: Johnathan Hausen, MD;  Location: [place];  Service: General;  Laterality: Right;   UPPER GI ENDOSCOPY  01/27/2012   Procedure: UPPER GI ENDOSCOPY;  Surgeon: Pedro Earls, MD;  [facility];  Service: General;  Laterality: N/A;    Family History  Problem Relation Age of Onset   Colitis Sister    Cancer Sister        cervial   Cancer Father        esophageal   Social History   Tobacco Use   Smoking status: Never   Smokeless tobacco: Never  Substance Use Topics   Alcohol use: Yes    Comment: 2 drinks a month   Drug use: No    Allergies  Allergen Reactions   Codeine Hives and Nausea Only    No outpatient  medications have been marked as taking for the 12/28/21 encounter (Office Visit) with Mordecai Rasmussen, MD.    Objective: Ht '5\' 5"'$  (1.651 m)   Wt 147 lb (66.7 kg)   BMI 24.46 kg/m   Physical Exam:  General: Alert and oriented. and No acute distress. Gait: Normal gait.  Hips and lower back without deformity.  No point tenderness over the greater trochanter.  Mild discomfort over the lower back, and into the buttocks.  Negative straight leg raise bilaterally.  She has excellent lower body strength.  Sensation is intact distally.  IMAGING: I personally ordered and reviewed the following images   Lumbar spine x-rays were obtained in clinic today.  No acute injuries are noted.  Minimal degenerative changes.  There are some small anterior osteophytes.  Mild scoliotic curvature in the thoracolumbar region.  Well-maintained disc height.  No anterolisthesis.  No bony lesions.  Impression: Mild scoliotic curve of the thoracolumbar region, with mild degenerative changes.   AP pelvis and x-rays of bilateral hips were obtained in clinic today.  No acute injuries are noted.  No prior comparison.  Well-maintained joint space.  No evidence of advanced degenerative changes.  No osteophytes are appreciated.  No evidence of head neck offset disease.  No bony lesions.  Impression: Negative bilateral hip x-rays   New Medications:  No orders of the defined types were placed in this encounter.     Mordecai Rasmussen, MD  12/28/2021 11:02 PM

## 2022-06-25 IMAGING — MG MM BREAST LOCALIZATION CLIP
4 series · 4 of 12 positions shown · non-contrast
Comparison: none

CLINICAL DATA: Right breast mammogram status post radioactive seed
localization under ultrasound guidance.

EXAM:
DIAGNOSTIC RIGHT MAMMOGRAM POST ULTRASOUND-GUIDED RADIOACTIVE SEED
PLACEMENT

[R ML synth-2D]
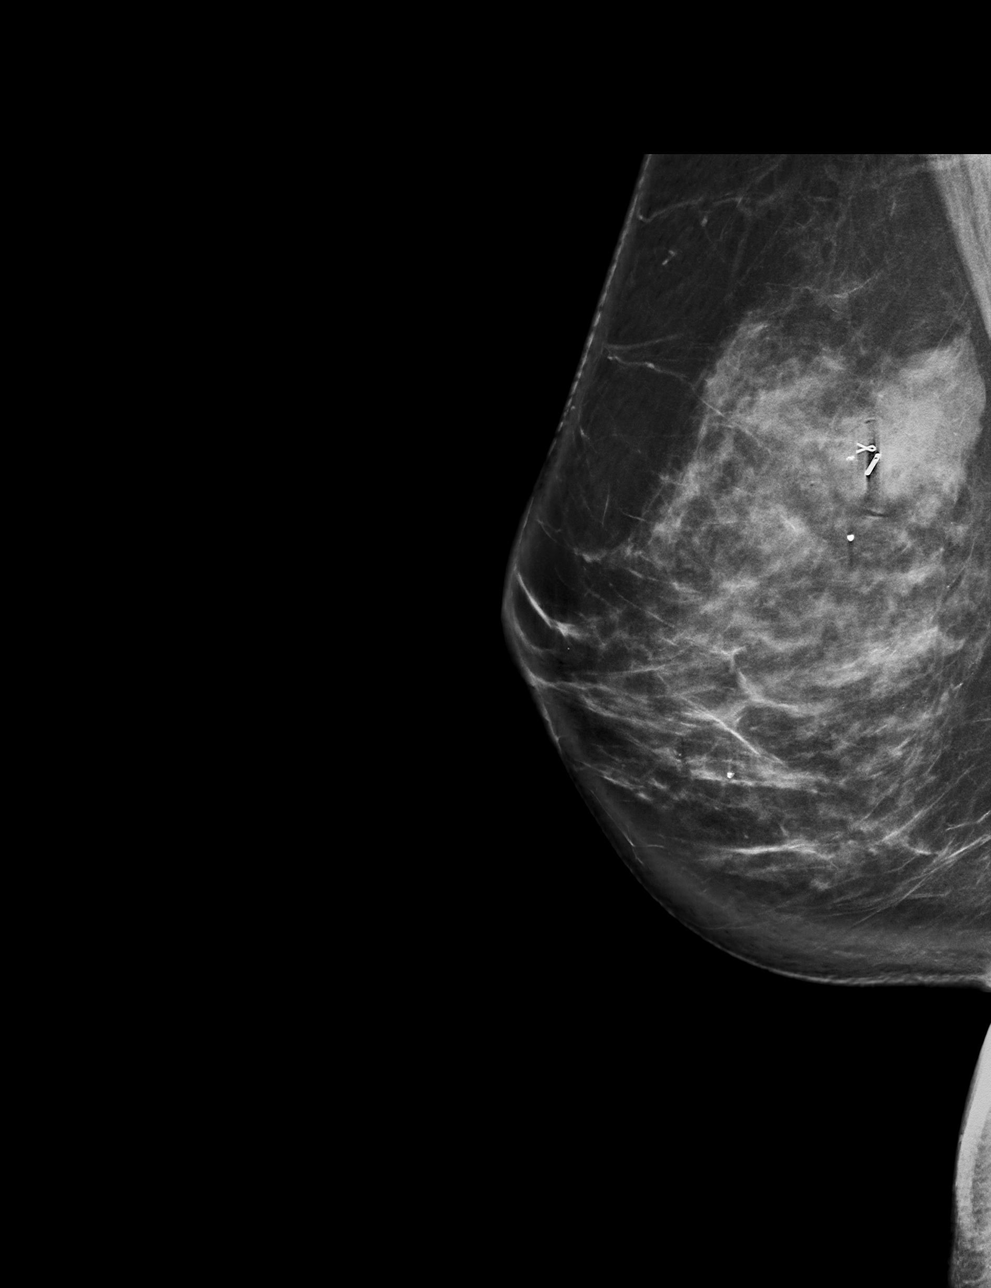

[R CC synth-2D]
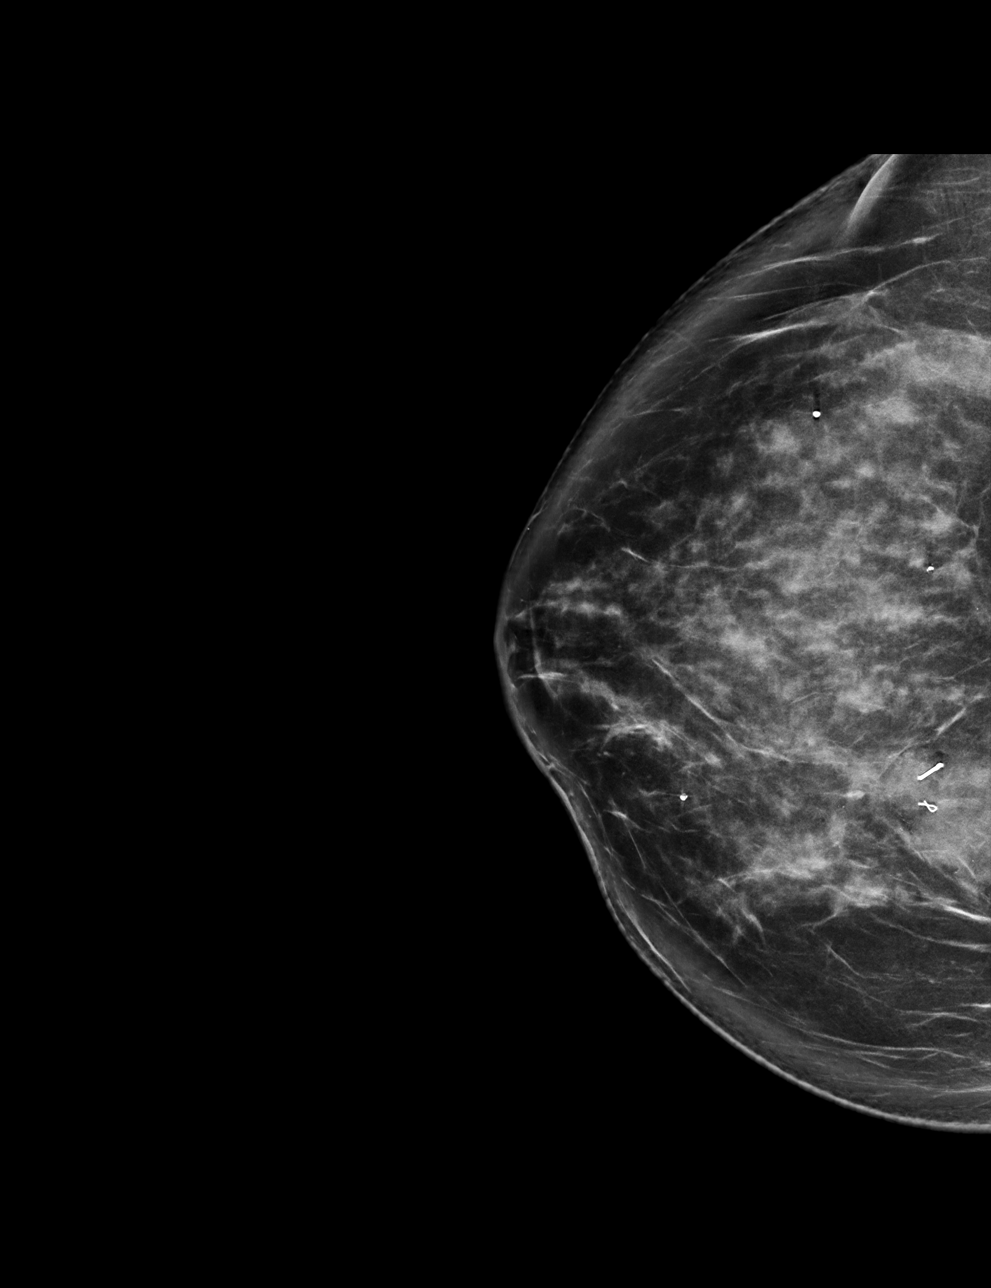

[R CC tomo · tomo slice 41/80.0]
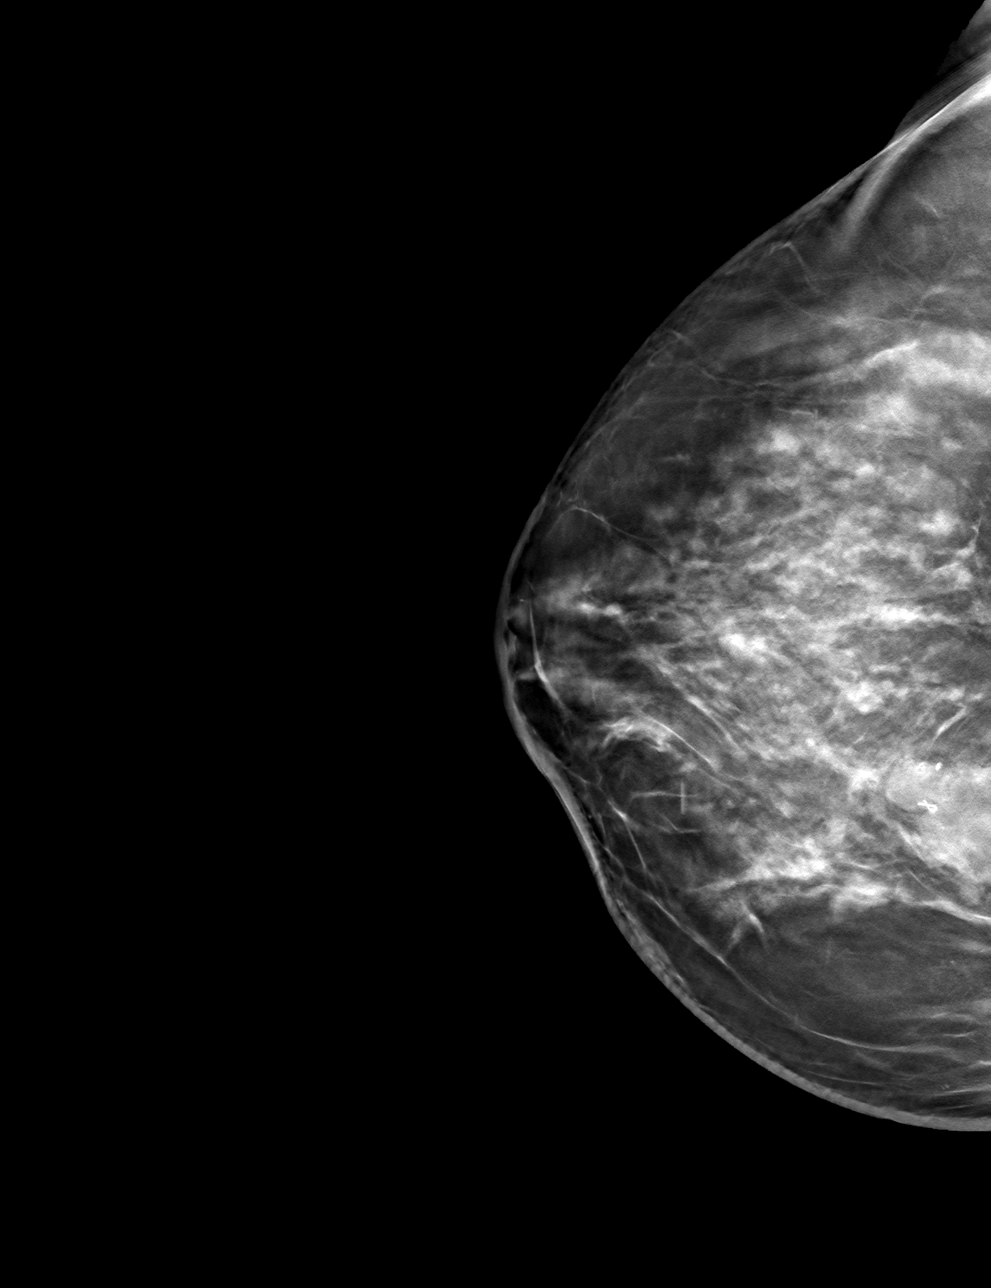

[R ML tomo · tomo slice 43/84.0]
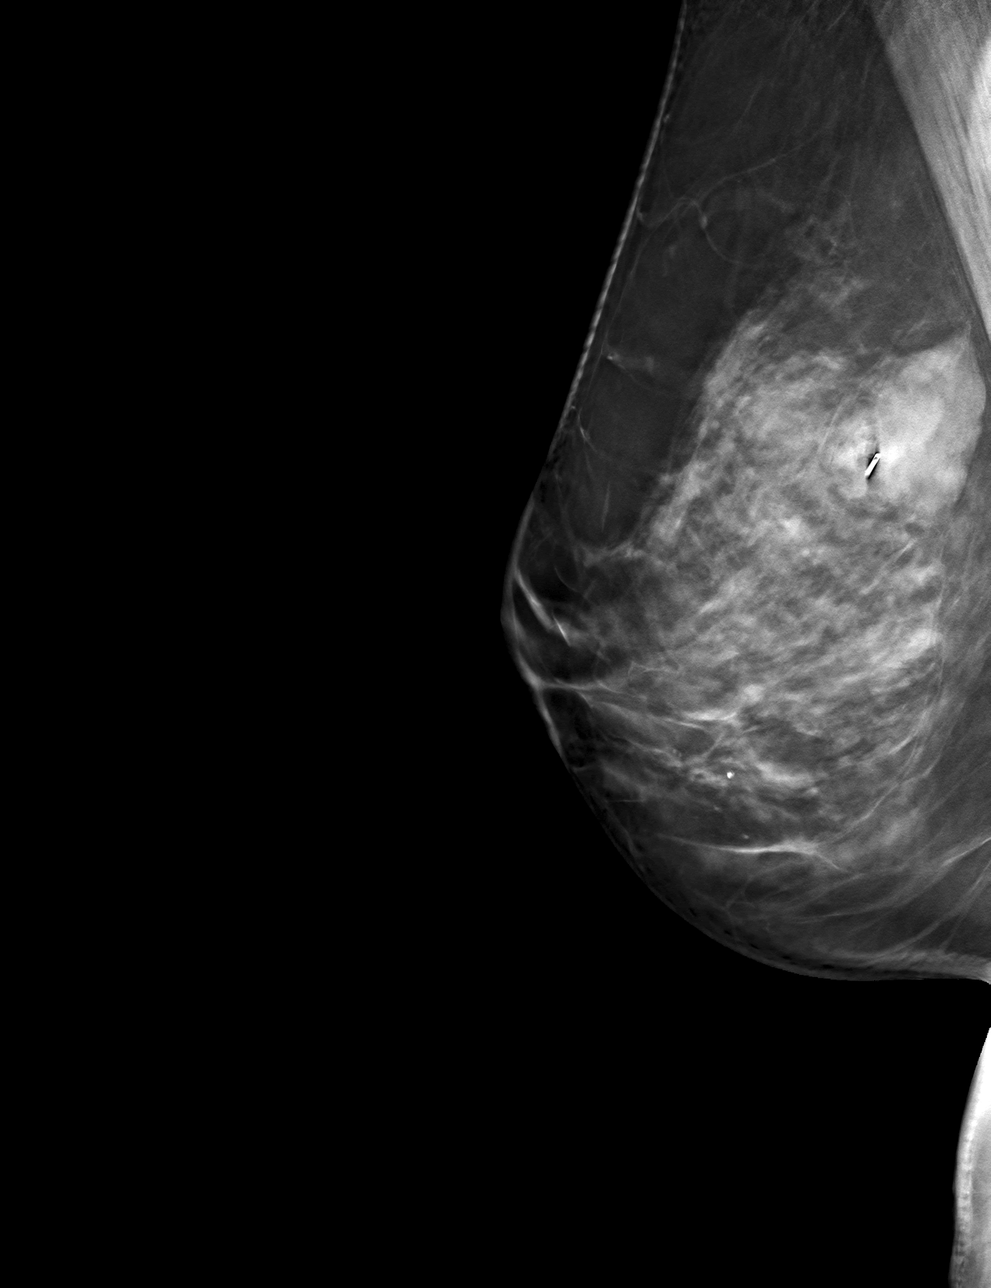

[4 of 12 positions shown; findings below may reference images not displayed]

FINDINGS: Mammographic images were obtained following ultrasound-guided
radioactive seed placement. These demonstrate the radioactive seed
is well positioned within the mass and next to the ribbon shaped
biopsy marking clip in the upper inner posterior right breast.
IMPRESSION: Appropriate location of the radioactive seed.

Final Assessment: Post Procedure Mammograms for Seed Placement

## 2022-06-25 IMAGING — US US NEEDLE LOCALIZATION*R*
1 series · 4 of 4 positions shown · non-contrast
Comparison: Previous exam(s).

CLINICAL DATA: 75-year-old female presenting for radioactive seed
localization of the right breast prior to lumpectomy.

EXAM:
ULTRASOUND GUIDED RADIOACTIVE SEED LOCALIZATION OF THE RIGHT BREAST

[Series 1: us needle localization*right* · 0.07mm/px · 4 of 4 slices shown]
[im 1/4]
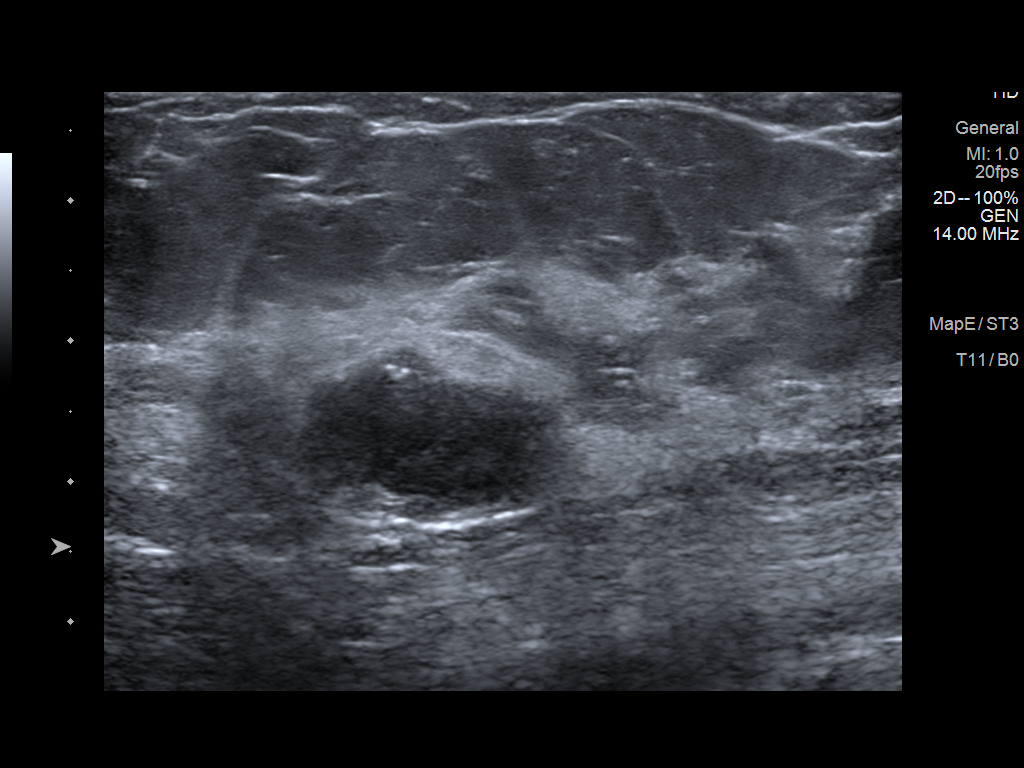
[im 2/4]
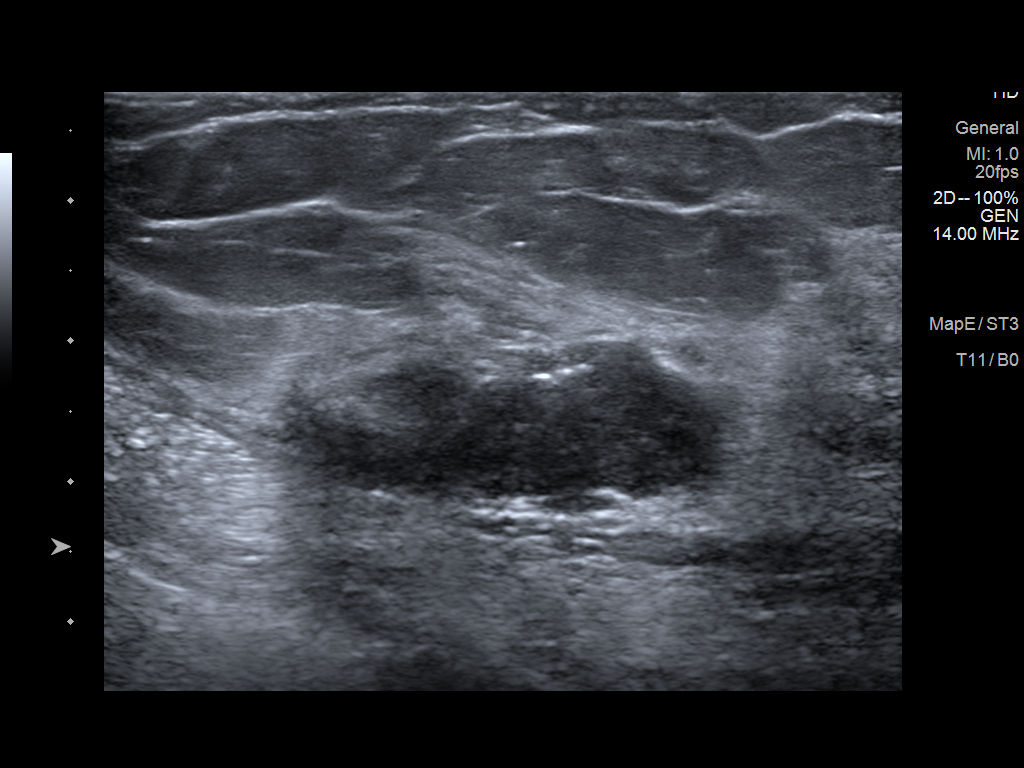
[im 3/4]
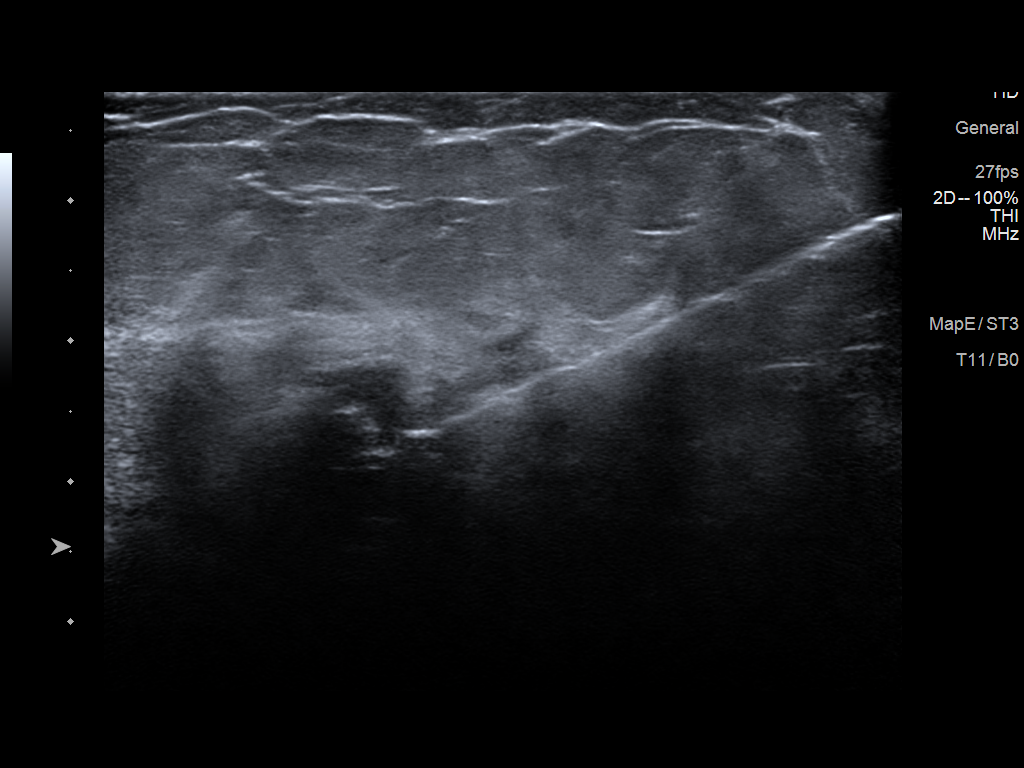
[im 4/4]
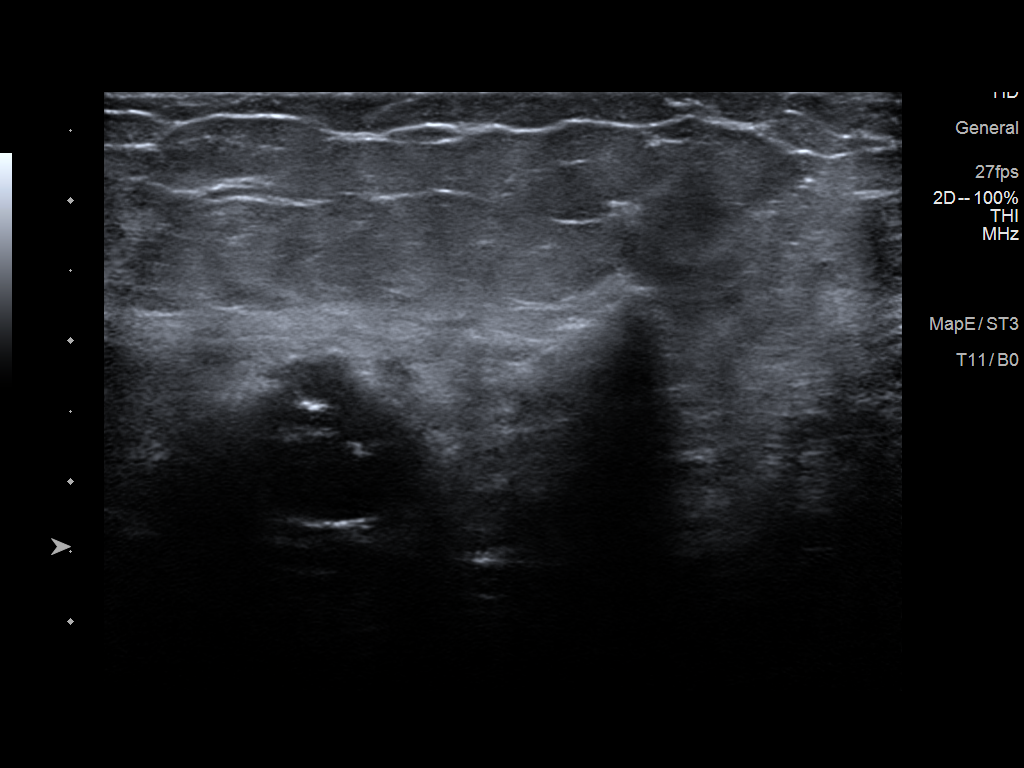

[4 of 4 positions shown; findings below may reference images not displayed]

FINDINGS: Patient presents for radioactive seed localization prior to right
breast lumpectomy. I met with the patient and we discussed the
procedure of seed localization including benefits and alternatives.
We discussed the high likelihood of a successful procedure. We
discussed the risks of the procedure including infection, bleeding,
tissue injury and further surgery. We discussed the low dose of
radioactivity involved in the procedure. Informed, written consent
was given.

The usual time-out protocol was performed immediately prior to the
procedure.

Using ultrasound guidance, sterile technique, 1% lidocaine and an
2-L8A radioactive seed, the mass in the right breast at 1 o'clock, 3
cm from the nipple was localized using an inferior approach. The
follow-up mammogram images confirm the seed in the expected location
and were marked for Dr. Asaa.

Follow-up survey of the patient confirms presence of the radioactive
seed.

Order number of [id number] seed:  [id number].

Total activity:  0.246 millicuries reference Date: 12/15/2020

The patient tolerated the procedure well and was released from the
[REDACTED]. She was given instructions regarding seed removal.
IMPRESSION: Radioactive seed localization right breast. No apparent
complications.
